# Patient Record
Sex: Female | Born: 1995 | Race: White | Hispanic: No | Marital: Married | Smoking: Current every day smoker
Health system: Southern US, Community
[De-identification: ages and names within clinical notes are randomized; demographics above are authoritative.]

## PROBLEM LIST (undated history)

## (undated) ENCOUNTER — Emergency Department (HOSPITAL_COMMUNITY): Admission: EM | Payer: Medicaid Other | Source: Home / Self Care

## (undated) DIAGNOSIS — N809 Endometriosis, unspecified: Secondary | ICD-10-CM

## (undated) DIAGNOSIS — K449 Diaphragmatic hernia without obstruction or gangrene: Secondary | ICD-10-CM

## (undated) DIAGNOSIS — E282 Polycystic ovarian syndrome: Secondary | ICD-10-CM

## (undated) HISTORY — PX: ESOPHAGOGASTRODUODENOSCOPY: SHX1529

## (undated) HISTORY — PX: TONSILLECTOMY: SUR1361

## (undated) HISTORY — PX: ADENOIDECTOMY: SUR15

---

## 2020-06-08 ENCOUNTER — Emergency Department (HOSPITAL_COMMUNITY)
Admission: EM | Admit: 2020-06-08 | Discharge: 2020-06-08 | Disposition: A | Discharge status: Home / Self Care | Payer: Self-pay | Attending: Emergency Medicine | Admitting: Emergency Medicine

## 2020-06-08 ENCOUNTER — Encounter (HOSPITAL_COMMUNITY): Payer: Self-pay | Admitting: *Deleted

## 2020-06-08 ENCOUNTER — Other Ambulatory Visit: Payer: Self-pay

## 2020-06-08 DIAGNOSIS — L03011 Cellulitis of right finger: Secondary | ICD-10-CM | POA: Insufficient documentation

## 2020-06-08 HISTORY — DX: Endometriosis, unspecified: N80.9

## 2020-06-08 MED ORDER — IBUPROFEN 800 MG PO TABS
800.0000 mg | ORAL_TABLET | Freq: Once | ORAL | Status: AC
  Administered 2020-06-08: 800 mg via ORAL
  Filled 2020-06-08: qty 1

## 2020-06-08 MED ORDER — DOXYCYCLINE HYCLATE 100 MG PO TABS
100.0000 mg | ORAL_TABLET | Freq: Once | ORAL | Status: AC
  Administered 2020-06-08: 100 mg via ORAL
  Filled 2020-06-08: qty 1

## 2020-06-08 MED ORDER — DOXYCYCLINE HYCLATE 100 MG PO CAPS: 100.0000 mg | ORAL_CAPSULE | Freq: Two times a day (BID) | ORAL | 0 refills | Status: AC

## 2020-06-08 MED ORDER — LIDOCAINE HCL 2 % IJ SOLN
10.0000 mL | Freq: Once | INTRAMUSCULAR | Status: AC
  Administered 2020-06-08: 200 mg
  Filled 2020-06-08: qty 20

## 2020-06-08 NOTE — ED Triage Notes (Signed)
The tip of her her right third finger is red and swollen. She tried to lance it last night, had some drainage. Swelling returned. No injury, states it felt like an ingrown fingernail.

## 2020-06-08 NOTE — ED Provider Notes (Signed)
[facility]-EMERGENCY DEPT Provider Note   CSN: [id number] Arrival date & time: 06/08/20  1428     History Chief Complaint  Patient presents with  . Hand Pain    Erica Rivers is a 25 y.o. female who presents with concern for swelling and pain to the right middle finger as well as redness x3 days.  Patient states she frequently gets small infections around the nailbeds, as she has frequent ingrown nails and gets her nails done at nail salons fairly frequently, states she usually drains them on her own with something sharp at home. She states that she utilized an ice pack and then razor blades last night to try to open up the infection with significant amount of pus drained from the area, however it close back up and is swollen more this morning and is giving her severe pain, which is not relieved by ibuprofen or Tylenol.  She denies any fevers or chills, denies any pain in her proximal third finger or right hand.  I personally reviewed this patient's medical records she is history of endometriosis and multiple antibiotic allergies, which reaction she describes as anaphylactic shock.  HPI     Past Medical History:  Diagnosis Date  . Endometriosis     There are no problems to display for this patient.   History reviewed. No pertinent surgical history.   OB History   No obstetric history on file.     No family history on file.     Home Medications Prior to Admission medications   Medication Sig Start Date End Date Taking? Authorizing Provider  doxycycline (VIBRAMYCIN) 100 MG capsule Take 1 capsule (100 mg total) by mouth 2 (two) times daily for 5 days. 06/08/20 06/13/20 Yes Sponseller, Rebekah R, PA-C    Allergies    Bactrim [sulfamethoxazole-trimethoprim], Levaquin [levofloxacin], and Penicillins  Review of Systems   Review of Systems  Constitutional: Negative.   HENT: Negative.   Eyes: Negative.   Respiratory: Negative.   Cardiovascular: Negative.    Gastrointestinal: Negative.   Genitourinary: Negative.   Skin: Positive for color change.       Redness and swelling to right third finger  Neurological: Negative.     Physical Exam Updated Vital Signs BP (!) 140/93 (BP Location: Right Arm)   Pulse 96   Temp 98.5 F (36.9 C) (Oral)   Resp 18   Ht 5\' 4"  (1.626 m)   Wt 120.2 kg   SpO2 96%   BMI 45.49 kg/m   Physical Exam Vitals and nursing note reviewed.  HENT:     Head: Normocephalic and atraumatic.     Nose: Nose normal.  Eyes:     General: No scleral icterus.       Right eye: No discharge.        Left eye: No discharge.     Conjunctiva/sclera: Conjunctivae normal.  Cardiovascular:     Rate and Rhythm: Normal rate.     Pulses:          Radial pulses are 2+ on the right side and 2+ on the left side.  Pulmonary:     Effort: Pulmonary effort is normal.  Musculoskeletal:     Right hand: Swelling and tenderness present. Normal capillary refill.     Left hand: Normal.       Hands:     Comments: Full sensation and ROM of the PIP and MCP joints of the right third finger. Normal cap refill  Skin:  General: Skin is warm and dry.     Capillary Refill: Capillary refill takes less than 2 seconds.  Neurological:     General: No focal deficit present.     Mental Status: She is alert.  Psychiatric:        Mood and Affect: Mood normal.     ED Results / Procedures / Treatments   Labs (all labs ordered are listed, but only abnormal results are displayed) Labs Reviewed - No data to display  EKG None  Radiology No results found.  Procedures .Marland KitchenIncision and Drainage  Date/Time: 06/08/2020 5:25 PM Performed by: Paris Lore, PA-C Authorized by: Paris Lore, PA-C   Consent:    Consent obtained:  Verbal   Consent given by:  Patient   Risks discussed:  Bleeding, incomplete drainage, pain and damage to other organs   Alternatives discussed:  No treatment Universal protocol:    Procedure explained  and questions answered to patient or proxy's satisfaction: yes     Relevant documents present and verified: yes     Test results available : yes     Imaging studies available: yes     Required blood products, implants, devices, and special equipment available: yes     Site/side marked: yes     Immediately prior to procedure, a time out was called: yes     Patient identity confirmed:  Verbally with patient Location:    Indications for incision and drainage: Paronychia.   Location:  Upper extremity   Upper extremity location:  Finger   Finger location:  R long finger Pre-procedure details:    Skin preparation:  Betadine Anesthesia:    Anesthesia method:  Nerve block   Block location:   right third finger   Block needle gauge:  25 G   Block anesthetic:  Lidocaine 2% w/o epi   Block technique:  Digital block    Block injection procedure:  Anatomic landmarks identified, introduced needle, negative aspiration for blood and incremental injection   Block outcome:  Anesthesia achieved Procedure type:    Complexity:  Complex Procedure details:    Incision type: 11 blade scalpel swept along the eponychial fold.   Incision depth:  Subcutaneous   Scalpel blade:  11   Wound management:  Probed and deloculated, irrigated with saline and extensive cleaning   Drainage:  Purulent and bloody   Drainage amount:  Copious   Wound treatment:  Wound left open   Packing materials:  None Post-procedure details:    Procedure completion:  Tolerated well, no immediate complications     Medications Ordered in ED Medications  lidocaine (XYLOCAINE) 2 % (with pres) injection 200 mg (200 mg Other Given 06/08/20 1621)  doxycycline (VIBRA-TABS) tablet 100 mg (100 mg Oral Given 06/08/20 1720)  ibuprofen (ADVIL) tablet 800 mg (800 mg Oral Given 06/08/20 1720)    ED Course  I have reviewed the triage vital signs and the nursing notes.  Pertinent labs & imaging results that were available during my care of the  patient were reviewed by me and considered in my medical decision making (see chart for details).    MDM Rules/Calculators/A&P                         25 year old female presents with 3 days of infection to the right middle distal finger around the nail bed.  Hypertensive on intake, vital signs otherwise normal.  Physical exam concerning for paronychia of the right  third finger. Drained as recorded above. Patient tolerated well.   First dose of doxy given in ED, with ibuprofen. Recommend PCP follow up.   Nautia voiced understanding of her medical evaluation and treatment plan.  Each of her questions answered to her expressed aspect.  Return precautions given.  Patient is well-appearing, stable, and appropriate for discharge at this time.  This chart was dictated using voice recognition software, Dragon. Despite the best efforts of this provider to proofread and correct errors, errors may still occur which can change documentation meaning.  Final Clinical Impression(s) / ED Diagnoses Final diagnoses:  Paronychia of finger, right    Rx / DC Orders ED Discharge Orders         Ordered    doxycycline (VIBRAMYCIN) 100 MG capsule  2 times daily        06/08/20 1713           Sponseller, Eugene Gavia, PA-C 06/08/20 1747    Rozelle Logan, DO 06/08/20 1751

## 2020-06-08 NOTE — Discharge Instructions (Signed)
You were seen in the emergency department today for the infection in the tip of your finger.  Fortunately, we were able to drain a significant amount of  pus from your finger.  You were administered ibuprofen and first dose of antibiotics while in the emergency department.  You was prescribed 5 days of antibiotics to take twice daily at home.  Please take them for the entire course as prescribed.  You may continue to take Tylenol/ ibuprofen as needed for your symptoms.  Return to the emergency department if you develop worsening pain, swelling, redness streaking up your hand, weakness in your hand, or any other new severe symptoms.

## 2020-06-11 ENCOUNTER — Emergency Department (HOSPITAL_COMMUNITY): Payer: Self-pay

## 2020-06-11 ENCOUNTER — Other Ambulatory Visit: Payer: Self-pay

## 2020-06-11 ENCOUNTER — Emergency Department (HOSPITAL_COMMUNITY)
Admission: EM | Admit: 2020-06-11 | Discharge: 2020-06-11 | Disposition: A | Discharge status: Home / Self Care | Payer: Self-pay | Attending: Emergency Medicine | Admitting: Emergency Medicine

## 2020-06-11 ENCOUNTER — Encounter (HOSPITAL_COMMUNITY): Payer: Self-pay

## 2020-06-11 DIAGNOSIS — F17210 Nicotine dependence, cigarettes, uncomplicated: Secondary | ICD-10-CM | POA: Insufficient documentation

## 2020-06-11 DIAGNOSIS — K921 Melena: Secondary | ICD-10-CM | POA: Insufficient documentation

## 2020-06-11 DIAGNOSIS — K920 Hematemesis: Secondary | ICD-10-CM | POA: Insufficient documentation

## 2020-06-11 HISTORY — DX: Polycystic ovarian syndrome: E28.2

## 2020-06-11 HISTORY — DX: Diaphragmatic hernia without obstruction or gangrene: K44.9

## 2020-06-11 LAB — CBC WITH DIFFERENTIAL/PLATELET
Abs Immature Granulocytes: 0.05 10*3/uL (ref 0.00–0.07)
Basophils Absolute: 0.1 10*3/uL (ref 0.0–0.1)
Basophils Relative: 1 %
Eosinophils Absolute: 0.3 10*3/uL (ref 0.0–0.5)
Eosinophils Relative: 3 %
HCT: 45.6 % (ref 36.0–46.0)
Hemoglobin: 14.7 g/dL (ref 12.0–15.0)
Immature Granulocytes: 1 %
Lymphocytes Relative: 24 %
Lymphs Abs: 2.4 10*3/uL (ref 0.7–4.0)
MCH: 29.8 pg (ref 26.0–34.0)
MCHC: 32.2 g/dL (ref 30.0–36.0)
MCV: 92.3 fL (ref 80.0–100.0)
Monocytes Absolute: 0.7 10*3/uL (ref 0.1–1.0)
Monocytes Relative: 7 %
Neutro Abs: 6.5 10*3/uL (ref 1.7–7.7)
Neutrophils Relative %: 64 %
Platelets: 320 10*3/uL (ref 150–400)
RBC: 4.94 MIL/uL (ref 3.87–5.11)
RDW: 13.5 % (ref 11.5–15.5)
WBC: 10.1 10*3/uL (ref 4.0–10.5)
nRBC: 0 % (ref 0.0–0.2)

## 2020-06-11 LAB — TYPE AND SCREEN
ABO/RH(D): A POS
Antibody Screen: NEGATIVE

## 2020-06-11 LAB — COMPREHENSIVE METABOLIC PANEL
ALT: 27 U/L (ref 0–44)
AST: 24 U/L (ref 15–41)
Albumin: 3.8 g/dL (ref 3.5–5.0)
Alkaline Phosphatase: 66 U/L (ref 38–126)
Anion gap: 9 (ref 5–15)
BUN: 17 mg/dL (ref 6–20)
CO2: 23 mmol/L (ref 22–32)
Calcium: 8.9 mg/dL (ref 8.9–10.3)
Chloride: 110 mmol/L (ref 98–111)
Creatinine, Ser: 1.09 mg/dL — ABNORMAL HIGH (ref 0.44–1.00)
GFR, Estimated: >60 mL/min (ref >60)
Glucose, Bld: 113 mg/dL — ABNORMAL HIGH (ref 70–99)
Potassium: 3.5 mmol/L (ref 3.5–5.1)
Sodium: 142 mmol/L (ref 135–145)
Total Bilirubin: 1.7 mg/dL — ABNORMAL HIGH (ref 0.3–1.2)
Total Protein: 7.3 g/dL (ref 6.5–8.1)

## 2020-06-11 LAB — I-STAT BETA HCG BLOOD, ED (MC, WL, AP ONLY): I-stat hCG, quantitative: <5 m[IU]/mL (ref <5)

## 2020-06-11 LAB — POC OCCULT BLOOD, ED: Fecal Occult Bld: POSITIVE — AB

## 2020-06-11 LAB — LIPASE, BLOOD: Lipase: 36 U/L (ref 11–51)

## 2020-06-11 MED ORDER — ONDANSETRON 4 MG PO TBDP: 4.0000 mg | ORAL_TABLET | Freq: Three times a day (TID) | ORAL | 0 refills | Status: AC | PRN

## 2020-06-11 MED ORDER — SODIUM CHLORIDE 0.9 % IV BOLUS
1000.0000 mL | Freq: Once | INTRAVENOUS | Status: AC
  Administered 2020-06-11: 1000 mL via INTRAVENOUS

## 2020-06-11 MED ORDER — FAMOTIDINE IN NACL 20-0.9 MG/50ML-% IV SOLN
20.0000 mg | Freq: Once | INTRAVENOUS | Status: AC
  Administered 2020-06-11: 20 mg via INTRAVENOUS
  Filled 2020-06-11: qty 50

## 2020-06-11 MED ORDER — ONDANSETRON 4 MG PO TBDP
4.0000 mg | ORAL_TABLET | Freq: Once | ORAL | Status: AC
  Administered 2020-06-11: 4 mg via ORAL
  Filled 2020-06-11: qty 1

## 2020-06-11 MED ORDER — FAMOTIDINE 40 MG PO TABS: 40.0000 mg | ORAL_TABLET | Freq: Every day | ORAL | 0 refills | Status: AC

## 2020-06-11 NOTE — ED Triage Notes (Signed)
Patient c/o bright red rectal bleeding and vomiting bright red blood this AM. Patient states she has had this happen before approx 4 months ago.

## 2020-06-11 NOTE — ED Provider Notes (Signed)
[facility]-EMERGENCY DEPT Provider Note   CSN: [id number] Arrival date & time: 06/11/20  1635     History Chief Complaint  Patient presents with  . Hematemesis  . Rectal Bleeding    Erica Rivers is a 25 y.o. female with a reported history of endometriosis, hiatal hernia, ulcers, PCOS, seizures(not on any medications).  Patient presents with a chief complaint of hematemesis and melena.  She reports that this morning at 8:30 AM she had 1 episode of vomiting and hematemesis.  Patient reports approximately 2 cups of bright red blood in her emesis.  Denies any coffee-ground emesis.  Patient reports vomiting occurred after taking doxycycline on an empty stomach.  Patient states that she has had similar episodes of in the past.  Last episode of hematemesis occurred July 2021.  Patient reports that this occurred when she was living in [place] and she was told in the emerge department that she had a hiatal hernia.    Reports that for the past few days her stool has been darker than usual.  Patient reports that today she had a runny dark black stool.  Patient denies any bright red blood in her stool, rectal bleeding or rectal pain.  She reports that she did take Pepto-Bismol earlier in the day.  Patient also reports that she engages in receptive anal sex.  Last episode of receptive anal sex occurred earlier today.  Patient endorses epigastric pain.  Patient reports that this pain has been constant since July 2021.  Patient denies any radiation of her pain.  Patient reports that this pain is unchanged.  Patient rates her pain 6/10 on the pain scale.  Patient denies any fevers, chills, chest pain, shortness of breath, abdominal distention, diarrhea, constipation, difficulty urinating, dysuria, urinary frequency, hematuria, vaginal bleeding, vaginal discharge, vaginal pain, lightheadedness, dizziness, syncopal episode.  She reports that due to her PCOS she does not have regular periods.   Patient reports that her last period occurred July 2021 when she was taking birth control.  G12 P0-0-12-0  Denies regular alcohol or NSAID use.  HPI     Past Medical History:  Diagnosis Date  . Endometriosis   . Hiatal hernia   . PCOS (polycystic ovarian syndrome)     There are no problems to display for this patient.   Past Surgical History:  Procedure Laterality Date  . ADENOIDECTOMY    . ESOPHAGOGASTRODUODENOSCOPY    . TONSILLECTOMY       OB History   No obstetric history on file.     Family History  Problem Relation Age of Onset  . Endometriosis Mother   . Polycystic ovary syndrome Mother   . Diabetes Father   . Heart failure Father     Social History   Tobacco Use  . Smoking status: Current Every Day Smoker    Packs/day: 1.00    Types: Cigarettes  . Smokeless tobacco: Never Used  Vaping Use  . Vaping Use: Never used  Substance Use Topics  . Alcohol use: Yes  . Drug use: Never    Home Medications Prior to Admission medications   Medication Sig Start Date End Date Taking? Authorizing Provider  doxycycline (VIBRAMYCIN) 100 MG capsule Take 1 capsule (100 mg total) by mouth 2 (two) times daily for 5 days. 06/08/20 06/13/20 Yes Sponseller, Rebekah R, PA-C  ibuprofen (ADVIL) 200 MG tablet Take 440 mg by mouth daily as needed (finger pain).   Yes [provider]    Allergies  Iodine, Penicillins, Shellfish allergy, Bactrim [sulfamethoxazole-trimethoprim], Cobalt, Lamictal [lamotrigine], Levaquin [levofloxacin], Nickel, and Trileptal [oxcarbazepine]  Review of Systems   Review of Systems  Constitutional: Negative for chills and fever.  Eyes: Negative for visual disturbance.  Respiratory: Negative for shortness of breath.   Cardiovascular: Negative for chest pain.  Gastrointestinal: Positive for nausea and vomiting. Negative for abdominal distention, abdominal pain, anal bleeding, blood in stool, constipation, diarrhea and rectal pain.   Genitourinary: Negative for difficulty urinating, dysuria, flank pain, frequency, genital sores, hematuria, pelvic pain, vaginal bleeding, vaginal discharge and vaginal pain.  Musculoskeletal: Negative for back pain and neck pain.  Skin: Negative for color change and rash.  Neurological: Negative for dizziness, syncope, light-headedness and headaches.  Psychiatric/Behavioral: Negative for confusion.    Physical Exam Updated Vital Signs BP 135/86 (BP Location: Left Arm)   Pulse 98   Temp 98.4 F (36.9 C) (Oral)   Resp 16   Ht 5\' 4"  (1.626 m)   Wt 120.2 kg   SpO2 99%   BMI 45.49 kg/m   Physical Exam Vitals and nursing note reviewed. Exam conducted with a chaperone present (Female nurse tech present as chaperone).  Constitutional:      General: She is not in acute distress.    Appearance: She is not ill-appearing, toxic-appearing or diaphoretic.  HENT:     Head: Normocephalic.  Eyes:     General: No scleral icterus.       Right eye: No discharge.        Left eye: No discharge.  Cardiovascular:     Rate and Rhythm: Normal rate.     Heart sounds: Normal heart sounds.  Pulmonary:     Effort: Pulmonary effort is normal. No tachypnea, bradypnea or respiratory distress.     Breath sounds: Normal breath sounds. No decreased breath sounds, wheezing, rhonchi or rales.  Abdominal:     General: Bowel sounds are normal. There is no distension. There are no signs of injury.     Palpations: Abdomen is soft. There is no mass or pulsatile mass.     Tenderness: There is generalized abdominal tenderness. There is no right CVA tenderness, left CVA tenderness, guarding or rebound. Negative signs include McBurney's sign.     Hernia: There is no hernia in the umbilical area or ventral area.     Comments: Mild tenderness throughout abdomen worse in epigastric area  Genitourinary:    Exam position: Knee-chest position.     Rectum: Guaiac result positive. No mass, tenderness, anal fissure,  external hemorrhoid or internal hemorrhoid. Normal anal tone.       Comments: Skin tag noted to right buttocks  No melena, or bright red blood noted on gloved hand after rectal exam Musculoskeletal:     Cervical back: Neck supple.     Right lower leg: No edema.     Left lower leg: No edema.  Skin:    General: Skin is warm and dry.  Neurological:     General: No focal deficit present.     Mental Status: She is alert.  Psychiatric:        Behavior: Behavior is cooperative.     ED Results / Procedures / Treatments   Labs (all labs ordered are listed, but only abnormal results are displayed) Labs Reviewed  COMPREHENSIVE METABOLIC PANEL - Abnormal; Notable for the following components:      Result Value   Glucose, Bld 113 (*)    Creatinine, Ser 1.09 (*)    Total  Bilirubin 1.7 (*)    All other components within normal limits  POC OCCULT BLOOD, ED - Abnormal; Notable for the following components:   Fecal Occult Bld POSITIVE (*)    All other components within normal limits  CBC WITH DIFFERENTIAL/PLATELET  LIPASE, BLOOD  I-STAT BETA HCG BLOOD, ED ([facility], [facility], [facility] ONLY)  TYPE AND SCREEN  ABO/RH    EKG None  Radiology CT Abdomen Pelvis Wo Contrast  Result Date: 06/11/2020 CLINICAL DATA:  Upper abdominal pain. EXAM: CT ABDOMEN AND PELVIS WITHOUT CONTRAST TECHNIQUE: Multidetector CT imaging of the abdomen and pelvis was performed following the standard protocol without IV contrast. COMPARISON:  None. FINDINGS: Lower chest: No acute abnormality. Hepatobiliary: No focal liver abnormality is seen. No gallstones, gallbladder wall thickening, or biliary dilatation. Pancreas: Unremarkable. No pancreatic ductal dilatation or surrounding inflammatory changes. Spleen: Normal in size without focal abnormality. Adrenals/Urinary Tract: Adrenal glands are unremarkable. Kidneys are normal, without renal calculi, focal lesion, or hydronephrosis. Bladder is unremarkable. Stomach/Bowel: Stomach is within  normal limits. Appendix appears normal. No evidence of bowel wall thickening, distention, or inflammatory changes. Vascular/Lymphatic: No significant vascular findings are present. No enlarged abdominal or pelvic lymph nodes. Reproductive: Uterus and bilateral adnexa are unremarkable. Other: No abdominal wall hernia or abnormality. No abdominopelvic ascites. Musculoskeletal: No acute or significant osseous findings. IMPRESSION: No acute or active process within the abdomen or pelvis. Electronically Signed   By: Aram Candela M.D.   On: 06/11/2020 19:51    Procedures Procedures   Medications Ordered in ED Medications  ondansetron (ZOFRAN-ODT) disintegrating tablet 4 mg (4 mg Oral Given 06/11/20 1810)  sodium chloride 0.9 % bolus 1,000 mL (0 mLs Intravenous Stopped 06/11/20 2053)  famotidine (PEPCID) IVPB 20 mg premix (0 mg Intravenous Stopped 06/11/20 2053)    ED Course  I have reviewed the triage vital signs and the nursing notes.  Pertinent labs & imaging results that were available during my care of the patient were reviewed by me and considered in my medical decision making (see chart for details).    MDM Rules/Calculators/A&P                          Alert 25 year old female no acute stress, nontoxic appearing.  Presents with chief complaint of one episode of hematemesis and one episode of melena.  Both episodes happened today.  Reports that she has a history of hiatal hernia and ulcers.  Patient reports that she has had similar episodes of hematemesis and melena in the past related to these symptoms.  The last episode occurred 10/2019.  Patient reports that she has followed up with GI in the past however is new to the area and has not established care with GI here.  On physical exam normoactive bowel sounds, abdomen soft, nondistended, patient has mild tenderness throughout her abdomen, with creased tenderness to epigastric area.  No mass, pulsatile mass, umbilical or ventral hernia noted.   Due to patient's complaint of melena rectal exam was performed, No melena, or bright red blood noted on gloved hand after rectal exam however Hemoccult positive.  We will obtain i-STAT beta-hCG, lipase, CBC, CMP, type and screen.  Patient given Zofran, 1 L fluid bolus, and Pepcid.  CBC is within normal limits.  No signs of acute blood loss anemia. CMP shows elevated creatinine; likely due to dehydration.  Total bili also elevated at 1.7, AST, ALT, alk phos all within normal limits.   Lipase within normal  limits low suspicion for acute pancreatitis. I-STAT beta-hCG less than 5 low suspicion for intrauterine or ectopic pregnancy.  Due to patient's complaint of hematemesis and generalized abdominal pain will obtain noncontrast CT scan of abdomen pelvis.  Noncontrast due to patient's history of iodine allergy.  Noncontrast CT abdomen pelvis showed no acute or active process within the abdomen or pelvis.  On serial reexamination patient's abdomen is soft, nondistended, no change in her tenderness.  Patient was not observed to have any episodes of vomiting throughout her ED stay.  Patient has been hemodynamically stable throughout her ED stay.    Possible that patient's dark stool was due to Pepto-Bismol use.  Will give patient prescription for Protonix and Zofran.  Will have patient follow-up with GI provider.  Discussed results, findings, treatment and follow up. Patient advised of return precautions. Patient verbalized understanding and agreed with plan.    Final Clinical Impression(s) / ED Diagnoses Final diagnoses:  Hematemesis with nausea    Rx / DC Orders ED Discharge Orders         Ordered    famotidine (PEPCID) 40 MG tablet  Daily        06/11/20 2040    ondansetron (ZOFRAN ODT) 4 MG disintegrating tablet  Every 8 hours PRN        06/11/20 2040           Berneice Heinrich 06/12/20 0029    Gwyneth Sprout, MD 06/14/20 2011

## 2020-06-11 NOTE — Discharge Instructions (Addendum)
You came to the emergency department today to be evaluated for your episode of bloody emesis and melena. Your CT scan showed no acute abnormality. Physical exam and lab work were reassuring. I have given you prescription for times. Please take 1 pill once a day. I have given you a prescription for Zofran. Please take this medication every 8 hours as needed for nausea.  Get help right away if: You faint. You feel weak or dizzy. You are urinating less than normal or not at all. You vomit up: Large amounts of blood or dark material that may look like coffee grounds. Bright red blood. You have any of the following: Persistent vomiting. A rapid heartbeat. Blood in your stool. Chest pain. Difficulty breathing.

## 2020-06-20 ENCOUNTER — Other Ambulatory Visit: Payer: Self-pay

## 2020-06-20 ENCOUNTER — Ambulatory Visit (INDEPENDENT_AMBULATORY_CARE_PROVIDER_SITE_OTHER): Payer: No Payment, Other | Admitting: Licensed Clinical Social Worker

## 2020-06-20 DIAGNOSIS — F4310 Post-traumatic stress disorder, unspecified: Secondary | ICD-10-CM | POA: Diagnosis not present

## 2020-06-22 ENCOUNTER — Encounter (HOSPITAL_COMMUNITY): Payer: Self-pay | Admitting: Psychiatry

## 2020-06-22 ENCOUNTER — Ambulatory Visit (INDEPENDENT_AMBULATORY_CARE_PROVIDER_SITE_OTHER): Payer: No Payment, Other | Admitting: Psychiatry

## 2020-06-22 ENCOUNTER — Other Ambulatory Visit: Payer: Self-pay

## 2020-06-22 VITALS — BP 136/94 | HR 102 | Ht 64.0 in | Wt 267.4 lb

## 2020-06-22 DIAGNOSIS — F411 Generalized anxiety disorder: Secondary | ICD-10-CM | POA: Insufficient documentation

## 2020-06-22 DIAGNOSIS — F3160 Bipolar disorder, current episode mixed, unspecified: Secondary | ICD-10-CM

## 2020-06-22 DIAGNOSIS — F4310 Post-traumatic stress disorder, unspecified: Secondary | ICD-10-CM | POA: Diagnosis not present

## 2020-06-22 MED ORDER — HYDROXYZINE HCL 10 MG PO TABS: 10.0000 mg | ORAL_TABLET | Freq: Three times a day (TID) | ORAL | 2 refills | Status: DC | PRN

## 2020-06-22 MED ORDER — ARIPIPRAZOLE 5 MG PO TABS: 5.0000 mg | ORAL_TABLET | Freq: Every day | ORAL | 2 refills | Status: DC

## 2020-06-22 MED ORDER — TRAZODONE HCL 50 MG PO TABS: 50.0000 mg | ORAL_TABLET | Freq: Every day | ORAL | 2 refills | Status: DC
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
## 2020-06-26 ENCOUNTER — Telehealth (HOSPITAL_COMMUNITY): Payer: Self-pay | Admitting: *Deleted

## 2020-06-26 NOTE — Telephone Encounter (Signed)
VM left for writer from [place] stating her medicine has given her tremors and she is needing something for them. Reviewed her med list and she is on abilify which could cause tremors. Will forward concern to her provider, Dr Doyne Keel to consider.

## 2020-06-27 ENCOUNTER — Other Ambulatory Visit (HOSPITAL_COMMUNITY): Payer: Self-pay | Admitting: Psychiatry

## 2020-06-27 DIAGNOSIS — F411 Generalized anxiety disorder: Secondary | ICD-10-CM

## 2020-06-27 DIAGNOSIS — R251 Tremor, unspecified: Secondary | ICD-10-CM

## 2020-06-27 MED ORDER — PROPRANOLOL HCL 40 MG PO TABS: 40.0000 mg | ORAL_TABLET | Freq: Two times a day (BID) | ORAL | 2 refills | Status: AC

## 2020-06-27 NOTE — Telephone Encounter (Signed)
Patient notes that after she takes her second dose of hydroxyzine she has tremors in her left arm.  Provider informed patient that hydroxyzine and Abilify can induce tremors.  Provider recommended switching medications or reducing the dose.  Patient notes that she likes her medication and does not want them changed.  She notes that in the short time she has been taking her medication her anxiety has improved and notes that she has less manic episodes.  Today she is agreeable to starting propanolol 40 mg twice daily to help manage tremors as well as anxiety.  She will continue all other medications prescribed.  She asked  provider if she can be diagnosed with disassociative personality disorder as she has discontinued her marijuana use.  Provider informed patient that she would be reassessed at her next visit.  No other concerns noted at this time.

## 2020-07-05 ENCOUNTER — Telehealth (HOSPITAL_COMMUNITY): Payer: Self-pay | Admitting: *Deleted

## 2020-07-05 NOTE — Telephone Encounter (Signed)
Call from patient stating the hydroxyzine wasn't really helping her any more and she wanted to see if her medicine could be increased to 10 mg. Reviewed record and the med list indicates 10 mg tid prn per Dr Doyne Keel. Called [place] back and she is taking 10 mg bid for the last few days and it helps some but states her bottle says to take 5 mg tid prn. Encouraged to continue at 10 mg at a time with the freedom to take it three times a day, and to do so if highly anxious rather than just two.states she is going thru some big changes, good ones but stressful none the less. To call back as needed.

## 2020-07-06 ENCOUNTER — Telehealth (HOSPITAL_COMMUNITY): Payer: Self-pay | Admitting: *Deleted

## 2020-07-06 ENCOUNTER — Other Ambulatory Visit (HOSPITAL_COMMUNITY): Payer: Self-pay | Admitting: Psychiatry

## 2020-07-06 DIAGNOSIS — F411 Generalized anxiety disorder: Secondary | ICD-10-CM

## 2020-07-06 MED ORDER — HYDROXYZINE HCL 25 MG PO TABS
25.0000 mg | ORAL_TABLET | Freq: Three times a day (TID) | ORAL | 2 refills | Status: DC | PRN
Start: 2020-07-06 — End: 2020-09-21

## 2020-07-06 NOTE — Telephone Encounter (Signed)
Provider attempted to call patient twice without success.  Provider increase hydroxyzine 10 mg 3 times daily to 25 mg 3 times daily as needed to help manage anxiety.  Provider left patient a message detailing this.  Provider informed patient to call if she had questions or concerns.  Medication sent to preferred pharmacy.  No other concerns noted at this time.

## 2020-07-06 NOTE — Telephone Encounter (Signed)
Erica Rivers called this am to state she checked her rx bottle and the hydroxyzine dose is 10 mg she has been taking rather than 5 mg as she told me yesterday. She is concerned its not really helping her and would like Dr Doyne Keel to consider increasing it. Will let provider know.

## 2020-07-06 NOTE — Telephone Encounter (Signed)
Called Erica Rivers back with information re the increase in her hydroxyzine dose and sig.

## 2020-07-24 ENCOUNTER — Encounter (HOSPITAL_COMMUNITY): Payer: Self-pay | Admitting: Psychiatry

## 2020-08-03 ENCOUNTER — Other Ambulatory Visit: Payer: Self-pay

## 2020-08-03 ENCOUNTER — Ambulatory Visit (INDEPENDENT_AMBULATORY_CARE_PROVIDER_SITE_OTHER): Payer: No Payment, Other | Admitting: Licensed Clinical Social Worker

## 2020-08-03 DIAGNOSIS — F4310 Post-traumatic stress disorder, unspecified: Secondary | ICD-10-CM | POA: Diagnosis not present

## 2020-08-07 NOTE — Progress Notes (Signed)
   THERAPIST PROGRESS NOTE  Session Time: 45 min  Participation Level: Active  Behavioral Response: CasualAlertAnxious  Type of Therapy: Individual Therapy  Treatment Goals addressed: Communication: dep/anx/coping  Interventions: Supportive and Other: additional assessment  Summary: Erica Rivers is a 25 y.o. female who presents with hx of PTSD. This date pt comes for in person session. Pt drove self to appt. This is first session since initial session. When asked how pt has been since first meeting she states "It's been like a roller coaster". Pt reports she is still living with her wife and husband. She states "both relationships almost ended". Pt reports they end up arguing about "alone time with him", Erica Rivers. Pt reports irritability/anger. She then states they are all getting along at present. She states she and Erica Rivers are planning their wedding. She further states they are all working on a plan to move to [place]. She states Erica Rivers refuses meds and he has Bipolar Dis and PTSD. She reports Erica Rivers wants to get off pain meds she is on and cannabis use would help both of them. Pt reports they want to live where it is legal to use. Pt states they are currently living in [facility] and Erica Rivers did add pt and Erica Rivers to lease. LCSW assessed for pt's typical day. 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 Pt reports she is working on her GED. She says she is almost done and then wants to get her BS in psychology from [employer]. LCSW assessed for status of communication with mother. Pt states she had her blocked on everything until yesterday. She advises she called mother to invite her to wedding with Erica Rivers. Pt states they got in an argument. Mother is reportedly getting married in the coming months too. Pt states she will "...have to change my colors because hers are the same". LCSW assisted pt to  reframe this with introduction of self talk and logic/reality. Assessed for overall goal of relationship with mother and pt states "I don't want one". She reports mother continues to keep her minor sister Erica Rivers(pronounced"Meeka") from her and how painful this is. Pt admits she is angry often and may have an anger management problem. She has never had anger management training per her report. Pt agrees to take self talk literature home to read. LCSW reviewed poc including scheduling prior to close of session. Pt states appreciation for care.    Suicidal/Homicidal: Nowithout intent/plan  Therapist Response: Pt remains receptive to care.  Plan: Return again for next avail appt.  Diagnosis: Axis I: Post Traumatic Stress Disorder  [place] Sink, LCSW 08/07/2020

## 2020-08-08 ENCOUNTER — Other Ambulatory Visit (HOSPITAL_COMMUNITY): Payer: Self-pay | Admitting: Psychiatry

## 2020-08-08 ENCOUNTER — Telehealth (HOSPITAL_COMMUNITY): Payer: Self-pay | Admitting: *Deleted

## 2020-08-08 DIAGNOSIS — F411 Generalized anxiety disorder: Secondary | ICD-10-CM

## 2020-08-08 DIAGNOSIS — F3160 Bipolar disorder, current episode mixed, unspecified: Secondary | ICD-10-CM

## 2020-08-08 MED ORDER — TRAZODONE HCL 50 MG PO TABS: 50.0000 mg | ORAL_TABLET | Freq: Every day | ORAL | 2 refills | Status: DC

## 2020-08-08 MED ORDER — TRAZODONE HCL 100 MG PO TABS: 100.0000 mg | ORAL_TABLET | Freq: Every day | ORAL | 2 refills | Status: DC

## 2020-08-08 MED ORDER — ARIPIPRAZOLE (SENSOR) 10 MG PO TABS: 10.0000 mg | ORAL_TABLET | Freq: Every day | ORAL | 2 refills | Status: DC

## 2020-08-08 MED ORDER — ARIPIPRAZOLE 5 MG PO TABS: 5.0000 mg | ORAL_TABLET | Freq: Every day | ORAL | 2 refills | Status: DC

## 2020-08-08 MED ORDER — BUSPIRONE HCL 10 MG PO TABS: 10.0000 mg | ORAL_TABLET | Freq: Three times a day (TID) | ORAL | 2 refills | Status: DC

## 2020-08-08 NOTE — Telephone Encounter (Signed)
Patient notes that she has been having delusions noting that she believes her family is abandoning her.  She also notes that she has been having increased visual hallucinations noting that she sees the walls melting infront of her.  She informed Clinical research associate that she is only sleeping 3 to 4 hours nightly.  She also notes that hydroxyzine is ineffective and managing her anxiety.  Today she is agreeable to increasing Abilify 5 mg to 10 mg to help manage symptoms of psychosis and mood.  She is also agreeable to increasing trazodone 50 mg to 100 mg to help manage sleep.  She will start BuSpar 10 mg 3 times daily to help manage anxiety. Potential side effects of medication and risks vs benefits of treatment vs non-treatment were explained and discussed. All questions were answered.  No concerns noted at this time.

## 2020-08-08 NOTE — Telephone Encounter (Signed)
VM left on writers phone asking to get a message to her provider which is Dr Doyne Keel to ask if her Abilify can be increased to 10 mg as she has been on 5 mg and she doesn't feel its helping her any more. Will forward this message to provider.

## 2020-08-17 ENCOUNTER — Other Ambulatory Visit: Payer: Self-pay

## 2020-08-17 ENCOUNTER — Ambulatory Visit (INDEPENDENT_AMBULATORY_CARE_PROVIDER_SITE_OTHER): Payer: No Payment, Other | Admitting: Licensed Clinical Social Worker

## 2020-08-17 DIAGNOSIS — F4310 Post-traumatic stress disorder, unspecified: Secondary | ICD-10-CM

## 2020-08-22 ENCOUNTER — Telehealth: Payer: Self-pay

## 2020-08-22 NOTE — Telephone Encounter (Signed)
Copied from CRM [id number]. Topic: Appointment Scheduling - Scheduling Inquiry for Clinic >> Aug 22, 2020 11:52 AM Wyonia Hough E wrote: Reason for CRM: Pt would like speak with Mikle Bosworth about financial assistance/ Pt has a NP appt in July and had family planning medicaid only/ please advise

## 2020-08-22 NOTE — Telephone Encounter (Signed)
I return Pt call, LVM inform the Pt that she need to see the provider first before she can apply for financial assistance here at the clinic

## 2020-08-23 NOTE — Progress Notes (Signed)
   THERAPIST PROGRESS NOTE  Session Time: 45 min  Participation Level: Active  Behavioral Response: CasualAlertmildly anxious  Type of Therapy: Individual Therapy  Treatment Goals addressed: Anxiety and Coping  Interventions: CBT and Supportive  Summary: Erica Rivers is a 25 y.o. female who presents with hx of PTSD/anx/dep. This date pt returns for in person session. She states Erica Rivers drove her and is in the car. Pt states she is doing "good" when asked. Pt advises she just went on a "mini vacation". Pt reports her aunt came to pick her up and she went to grandmother's for 3 days. Pt states she got good rest, had a nice visit and it was very helpful to have a break from the day to day routine she is in. Pt states relationships with Erica Rivers and Erica Rivers are more stable with better communication. Pt advises Erica Rivers "officially asked me to marry her". She shows a new ring and states Erica Rivers got down on one knee, etc. They are all still planning to move to [place] but feels it will take some time as they try to save money. Pt continues to stay out of work. 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 She is looking forward to extra income for the household. She is spending time on GED and states that is going well. Pt states her mood is better with no panic attacks since starting Buspirone, less irritable. Pt has had no contact with mother and states "I don't feel bad about it. Pt brings up self talk literature LCSW provided last session. She states she has found this very helpful, states she is "catching myself" re negative statements. Reports she also found an app that gives her daily affirmations on her phone, which is helping her stay more positive. LCSW provided additional education on self talk/CBT. Pt denies other changes or new worries/concerns. LCSW reviewed poc with pts agreement including scheduling. Pt states appreciation for care.     Suicidal/Homicidal: Nowithout intent/plan  Therapist Response: Pt remains receptive to care.  Plan: Return again in 4 weeks.  Diagnosis: Axis I: Post Traumatic Stress Disorder  [place] Sink, LCSW 08/23/2020

## 2020-09-19 ENCOUNTER — Ambulatory Visit (HOSPITAL_COMMUNITY): Payer: No Payment, Other | Admitting: Licensed Clinical Social Worker

## 2020-09-19 ENCOUNTER — Ambulatory Visit (HOSPITAL_COMMUNITY): Payer: No Payment, Other | Admitting: Psychiatry

## 2020-09-21 ENCOUNTER — Ambulatory Visit (INDEPENDENT_AMBULATORY_CARE_PROVIDER_SITE_OTHER): Payer: No Payment, Other | Admitting: Psychiatry

## 2020-09-21 ENCOUNTER — Ambulatory Visit (HOSPITAL_COMMUNITY): Payer: No Payment, Other | Admitting: *Deleted

## 2020-09-21 ENCOUNTER — Other Ambulatory Visit: Payer: Self-pay

## 2020-09-21 ENCOUNTER — Encounter (HOSPITAL_COMMUNITY): Payer: Self-pay | Admitting: Psychiatry

## 2020-09-21 ENCOUNTER — Encounter (HOSPITAL_COMMUNITY): Payer: Self-pay

## 2020-09-21 VITALS — BP 112/70 | HR 68 | Ht 63.0 in | Wt 269.0 lb

## 2020-09-21 DIAGNOSIS — F3160 Bipolar disorder, current episode mixed, unspecified: Secondary | ICD-10-CM

## 2020-09-21 DIAGNOSIS — F411 Generalized anxiety disorder: Secondary | ICD-10-CM

## 2020-09-21 MED ORDER — TRAZODONE HCL 100 MG PO TABS: 100.0000 mg | ORAL_TABLET | Freq: Every day | ORAL | 2 refills | Status: DC

## 2020-09-21 MED ORDER — BUSPIRONE HCL 15 MG PO TABS: 15.0000 mg | ORAL_TABLET | Freq: Three times a day (TID) | ORAL | 2 refills | Status: DC

## 2020-09-21 MED ORDER — ABILIFY MAINTENA 400 MG IM PRSY: 400.0000 mg | PREFILLED_SYRINGE | INTRAMUSCULAR | 12 refills | Status: DC

## 2020-09-21 MED ORDER — ARIPIPRAZOLE ER 400 MG IM PRSY
400.0000 mg | PREFILLED_SYRINGE | INTRAMUSCULAR | Status: AC
  Administered 2020-09-21 – 2020-12-13 (×4): 400 mg via INTRAMUSCULAR

## 2020-09-21 MED ORDER — ARIPIPRAZOLE ER 400 MG IM PRSY: 400.0000 mg | PREFILLED_SYRINGE | Freq: Once | INTRAMUSCULAR | Status: AC

## 2020-09-21 NOTE — Progress Notes (Signed)
In today for an appt with her provider, Dr Doyne Keel. She determined she would be starting on Abilify M 400 mg today. Writer gave her the first injection in her R deltoid  without difficulty. She is pleasant and cooperative and fine with getting an injection. Med ed provided re injection. She completed patient assistance forms as she is uninsured. She is to return in one month for her next injection, and three months with the provider.

## 2020-09-21 NOTE — Progress Notes (Signed)
BH MD/PA/NP OP Progress Note  09/21/2020 11:13 AM Erica Rivers  MRN:  [id number]  Chief Complaint: " Things are better with medications" Chief Complaint   Medication Management    HPI: 25 year old female seen today for follow up psychiatric evaluation.  She has a psychiatric history of of marijuana use disorder, bipolar disorder, schizophrenia, and disassociative identity disorder.  She is currently managed on Abilify 10 mg daily, trazodone 50 mg nightly, hydroxyzine 25 mg 3 times daily, and BuSpar 10 mg 3 times daily.  She notes her medications are somewhat effective in managing her psychiatric conditions.  Today she is well-groomed, pleasant, cooperative, and engaged in conversation.  She informed provider that she is happy because she and her partners are getting married.  She notes that recently her boyfriend proposed to her and then she proposed her girlfriend.  She notes that the 3 of them will be getting married in September.  She notes that since being on Abilify her mood has been more stable.  She however still notes at times she becomes irritable, has fluctuations in mood, distractibility, and racing thoughts.  She also notes that she has visual hallucinations noting that she sees grass or other objects turn into ocean waves.  She denies auditory hallucinations.  Patient informed Clinical research associatewriter that she continues to have different personalities however informed Clinical research associatewriter that if she is not triggered these personalities did not appear.  Provider asked patient if she continues to utilize marijuana or other illegal substances and she notes that she does not.    Patient informed writer that her anxiety and depression continues to be high.  Today provider conducted a GAD-7 and patient scored a 19, at her last visit she scored a 20.  Provider also conducted a PHQ-9 and patient scored a 17, at her last visit she scored a 19.  She notes that she is sleeping 5 to 6 hours nightly.  She informed provider that her  appetite is poor however reports that she has been gaining weight noting that she has gained 5 pounds within the last 3 months.  She notes that her soon-to-be husband has her and her soon-to-be wife on a [place] diet consisting of high-protein and fruits/vegetables.  She notes that her soon-to-be wife has lost over 30 pounds however she is gaining weight.  She does note that she has polycystic ovarian syndrome which can be increasing her weight.  She notes that [facility].  Patient notes that she is concerned that she has ADHD.  She does endorse forgetfulness and inattentive to mentally taxing task.  Provider informed patient that she would like to have her mood stabilized prior to starting medication for ADHD.  She endorsed understanding.  Today she is agreeable to increasing trazodone 50mg  to 100 mg to help manage sleep, anxiety and depression.  She will also discontinue her oral Abilify and receive her first Abilify maintainer 400 mg injection today.  She is agreeable to increasing BuSpar 10 mg to 15 mg to help manage anxiety and depression.  She will discontinue hydroxyzine as she finds it ineffective.  She will follow-up with outpatient counseling for therapy.   Visit Diagnosis:    ICD-10-CM   1. Bipolar I disorder, most recent episode mixed (HCC)  F31.60 traZODone (DESYREL) 100 MG tablet    ARIPiprazole ER (ABILIFY MAINTENA) 400 MG prefilled syringe 400 mg    ARIPiprazole ER (ABILIFY MAINTENA) 400 MG PRSY  prefilled syringe    2. Generalized anxiety disorder  F41.1 busPIRone (BUSPAR) 15 MG tablet      Past Psychiatric History: marijuana use disorder, bipolar disorder, schizophrenia, and disassociative identity disorder  Past Medical History:  Past Medical History:  Diagnosis Date   Endometriosis    Hiatal hernia    PCOS (polycystic ovarian syndrome)     Past Surgical History:  Procedure Laterality Date    ADENOIDECTOMY     ESOPHAGOGASTRODUODENOSCOPY     TONSILLECTOMY      Family Psychiatric History:  Unknown  Family History:  Family History  Problem Relation Age of Onset   Endometriosis Mother    Polycystic ovary syndrome Mother    Diabetes Father    Heart failure Father     Social History:  Social History   Socioeconomic History   Marital status: Married    Spouse name: Not on file   Number of children: Not on file   Years of education: Not on file   Highest education level: Not on file  Occupational History   Not on file  Tobacco Use   Smoking status: Every Day    Packs/day: 1.00    Pack years: 0.00    Types: Cigarettes   Smokeless tobacco: Never  Vaping Use   Vaping Use: Never used  Substance and Sexual Activity   Alcohol use: Yes   Drug use: Never   Sexual activity: Not on file  Other Topics Concern   Not on file  Social History Narrative   Not on file   Social Determinants of Health   Financial Resource Strain: Not on file  Food Insecurity: Not on file  Transportation Needs: Not on file  Physical Activity: Not on file  Stress: Not on file  Social Connections: Not on file    Allergies:  Allergies  Allergen Reactions   Iodine Anaphylaxis   Penicillins Anaphylaxis   Shellfish Allergy Anaphylaxis   Bactrim [Sulfamethoxazole-Trimethoprim] Other (See Comments)    Caused fever   Cobalt Nausea And Vomiting and Other (See Comments)    Turns skin green/causes numbness   Lamictal [Lamotrigine] Other (See Comments)    Legs and arms went numb   Levaquin [Levofloxacin] Other (See Comments)    Red man syndrome   Nickel Nausea And Vomiting and Other (See Comments)    Turns skin green/causes numbness   Trileptal [Oxcarbazepine] Other (See Comments)    Caused dizziness/fainting spells    Metabolic Disorder Labs: No results found for: HGBA1C, MPG No results found for: PROLACTIN No results found for: CHOL, TRIG, HDL, CHOLHDL, VLDL, LDLCALC No results  found for: TSH  Therapeutic Level Labs: No results found for: LITHIUM No results found for: VALPROATE No components found for:  CBMZ  Current Medications: Current Outpatient Medications  Medication Sig Dispense Refill   ARIPiprazole ER (ABILIFY MAINTENA) 400 MG PRSY prefilled syringe Inject 400 mg into the muscle every 28 (twenty-eight) days. 1 each 12   famotidine (PEPCID) 40 MG tablet Take 1 tablet (40 mg total) by mouth daily. 30 tablet 0   ibuprofen (ADVIL) 200 MG tablet Take 440 mg by mouth daily as needed (finger pain).     ondansetron (ZOFRAN ODT) 4 MG disintegrating tablet Take 1 tablet (4 mg total) by mouth every 8 (eight) hours as needed for nausea or vomiting. 20 tablet 0   propranolol (INDERAL) 40 MG tablet Take 1 tablet (40 mg total) by mouth 2 (two) times daily. 60 tablet 2   busPIRone (BUSPAR)  15 MG tablet Take 1 tablet (15 mg total) by mouth 3 (three) times daily. 90 tablet 2   traZODone (DESYREL) 100 MG tablet Take 1 tablet (100 mg total) by mouth at bedtime. 30 tablet 2   Current Facility-Administered Medications  Medication Dose Route Frequency Provider Last Rate Last Admin   ARIPiprazole ER (ABILIFY MAINTENA) 400 MG prefilled syringe 400 mg  400 mg Intramuscular Q28 days Toy Cookey E, NP   400 mg at 09/21/20 1047   ARIPiprazole ER (ABILIFY MAINTENA) 400 MG prefilled syringe 400 mg  400 mg Intramuscular Once Shanna Cisco, NP         Musculoskeletal: Strength & Muscle Tone: within normal limits Gait & Station: normal Patient leans: N/A  Psychiatric Specialty Exam: Review of Systems  Blood pressure 112/70, pulse 68, height 5\' 3"  (1.6 m), weight 269 lb (122 kg).Body mass index is 47.65 kg/m.  General Appearance: Well Groomed  Eye Contact:  Good  Speech:  Clear and Coherent and Normal Rate  Volume:  Normal  Mood:  Anxious and Depressed  Affect:  Appropriate and Congruent  Thought Process:  Coherent, Goal Directed, and Linear  Orientation:  Full  (Time, Place, and Person)  Thought Content: WDL and Logical   Suicidal Thoughts:  No  Homicidal Thoughts:  No  Memory:  Immediate;   Good Recent;   Good Remote;   Good  Judgement:  Good  Insight:  Good  Psychomotor Activity:  Normal  Concentration:  Concentration: Good and Attention Span: Good  Recall:  Good  Fund of Knowledge: Good  Language: Good  Akathisia:  No  Handed:  Right  AIMS (if indicated): not done  Assets:  Communication Skills Desire for Improvement Financial Resources/Insurance Housing Intimacy Social Support  ADL's:  Intact  Cognition: WNL  Sleep:  Fair   Screenings: GAD-7    Office Visit from 09/21/2020 in [facility] Clinical Support from 06/22/2020 in [facility]  Total GAD-7 Score 19 20      PHQ2-9    Flowsheet Row Office Visit from 09/21/2020 in [facility] Counselor from 06/20/2020 in [facility]  PHQ-2 Total Score 0 3  PHQ-9 Total Score 17 19      Flowsheet Row Office Visit from 09/21/2020 in [facility] Clinical Support from 06/22/2020 in [facility] Counselor from 06/20/2020 in [facility]  C-SSRS RISK CATEGORY No Risk No Risk Low Risk        Assessment and Plan: Patient endorses symptoms of anxiety, depression, psychosis, and hypomania.  Today she is agreeable to starting Abilify maintaina that 400 mg injections.  She will receive her first shot today.  She is also agreeable to increasing trazodone 50 mg to 100 mg to help manage sleep, anxiety, and depression.  She will increase BuSpar 10 mg 3 times daily to 15 mg 3 times daily to help manage anxiety and depression.  At this time she notes she wants to discontinue hydroxyzine.  1. Bipolar I disorder, most recent episode mixed (HCC)  Increases- traZODone (DESYREL) 100 MG tablet;  Take 1 tablet (100 mg total) by mouth at bedtime.  Dispense: 30 tablet; Refill: 2 Start- ARIPiprazole ER (ABILIFY MAINTENA) 400 MG prefilled syringe 400 mg Start- ARIPiprazole ER (ABILIFY MAINTENA) 400 MG PRSY prefilled syringe; Inject 400 mg into the muscle every 28 (twenty-eight) days.  Dispense: 1 each; Refill: 12  2. Generalized anxiety disorder  Increased- busPIRone (BUSPAR) 15 MG tablet; Take 1 tablet (15 mg total) by mouth 3 (three) times daily.  Dispense: 90 tablet; Refill: 2   Follow-up in 3 months Follow-up with therapy Shanna Cisco, NP 09/21/2020, 11:13 AM

## 2020-10-04 ENCOUNTER — Other Ambulatory Visit: Payer: Self-pay

## 2020-10-04 ENCOUNTER — Encounter (HOSPITAL_COMMUNITY): Payer: Self-pay

## 2020-10-04 ENCOUNTER — Emergency Department (HOSPITAL_COMMUNITY)
Admission: EM | Admit: 2020-10-04 | Discharge: 2020-10-04 | Disposition: A | Discharge status: Home / Self Care | Payer: Self-pay | Attending: Emergency Medicine | Admitting: Emergency Medicine

## 2020-10-04 DIAGNOSIS — Z5321 Procedure and treatment not carried out due to patient leaving prior to being seen by health care provider: Secondary | ICD-10-CM | POA: Insufficient documentation

## 2020-10-04 DIAGNOSIS — N939 Abnormal uterine and vaginal bleeding, unspecified: Secondary | ICD-10-CM | POA: Insufficient documentation

## 2020-10-04 NOTE — ED Provider Notes (Signed)
Emergency Medicine Provider Triage Evaluation Note  Erica Rivers , a 25 y.o. female  was evaluated in triage.  Pt complains of vaginal bleeding for 9 days, states that she has been soaking through a super tampon every 30 minutes.  Patient reports history of PCOS and endometriosis, originally was not having periods but had started birth control years ago and started to have normal menses.  She denies any prior heavy bleeding.  She states she is passing large clots.  Denies any dizziness or weakness.  No shortness of breath.  This is never happened in the past.  Patient states that her wife told her to come and be evaluated, but she does state that she has a husband that she has not had vaginal sex with within the last 3 months.  Review of Systems  Positive: As above Negative: As above  Physical Exam  There were no vitals taken for this visit. Gen:   Awake, no distress   Resp:  Normal effort  MSK:   Moves extremities without difficulty  Other:    Medical Decision Making  Medically screening exam initiated at 6:36 PM.  Appropriate orders placed.  Lennie Dunnigan was informed that the remainder of the evaluation will be completed by another provider, this initial triage assessment does not replace that evaluation, and the importance of remaining in the ED until their evaluation is complete.     Leone Brand 10/04/20 1840    Cheryll Cockayne, MD 10/13/20 [phone]

## 2020-10-04 NOTE — ED Triage Notes (Signed)
Patient reports she is on day 9 of menstrual cycle and it is heavier and longer than normal. Patient has gone throw super tampon within 30 minutes.   C/o fatigue   A/Ox4 Ambulatory in triage.   Denies abdominal pain.

## 2020-10-12 ENCOUNTER — Ambulatory Visit: Payer: Medicaid Other | Attending: Family Medicine | Admitting: Family Medicine

## 2020-10-12 ENCOUNTER — Other Ambulatory Visit: Payer: Self-pay

## 2020-10-18 ENCOUNTER — Other Ambulatory Visit: Payer: Self-pay

## 2020-10-18 ENCOUNTER — Ambulatory Visit (INDEPENDENT_AMBULATORY_CARE_PROVIDER_SITE_OTHER): Payer: No Payment, Other | Admitting: Licensed Clinical Social Worker

## 2020-10-18 DIAGNOSIS — F411 Generalized anxiety disorder: Secondary | ICD-10-CM | POA: Diagnosis not present

## 2020-10-19 ENCOUNTER — Ambulatory Visit (HOSPITAL_COMMUNITY): Payer: No Payment, Other | Admitting: *Deleted

## 2020-10-19 ENCOUNTER — Encounter (HOSPITAL_COMMUNITY): Payer: Self-pay

## 2020-10-19 VITALS — BP 113/76 | HR 100 | Ht 63.0 in | Wt 262.0 lb

## 2020-10-19 DIAGNOSIS — F3160 Bipolar disorder, current episode mixed, unspecified: Secondary | ICD-10-CM

## 2020-10-19 NOTE — Progress Notes (Signed)
In as scheduled for her second Abilify M 400 mg injection. She has a good personal appearance, pleasant and verbal. Shared some information re her spouses today. She said her first shot went well and has no complaints to offer. She is planning to return home this am and go to bed. She got her shot in her L DELTOID this am without difficulty. To return in one month.

## 2020-10-21 NOTE — Progress Notes (Signed)
   THERAPIST PROGRESS NOTE  Session Time: 45 min  Participation Level: Active  Behavioral Response: CasualAlertAnxious  Type of Therapy: Individual Therapy  Treatment Goals addressed: Anxiety and Coping  Interventions: Solution Focused, Supportive, and Reframing  Summary: Erica Rivers is a 25 y.o. female who presents with hx of dep/anx/ptsd. This date pt returns for in person session. Last seen 08/17/20. Pt has her "wife", [name], with her and wants her to come in to session, which LCSW allows only briefly. Pt reports "a lot" is going on. She states she had to go to the ER recently as she had excessive vaginal bleeding. Pt states she was taken by [name] and [employer]. She states she ended up lying to them about being d/c when she actually left AMA. She confessed later. [name] allowed to comment re her concerns and states she now has trust issues with pt and this has hurt their relationship. LCSW acknowledged concerns and escorted [name] back to lobby.Most of session devoted to processing this situation and how pt may address it with both [place] and [place]. Pt states she feels [name] is "a lost cause" as he has said he may not be able to move forward after this. Pt reports she did not finish GED work by deadline and she is also distressed by this. She states today is her mother's birthday and this will be the first time she has not called her on her birthday. LCSW acknowledges feelings of loss. Pt reports she is taking meds as prescribed and pleased she has changed to injections. She feels she is improved with adjustment in dosing. Pt denies hallucinations. Reports she continues to note her self talk. 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 LCSW reviewed poc including scheduling prior to close of session. Pt states appreciation for care.     Suicidal/Homicidal: Nowithout intent/plan  Therapist Response: Pt remains mostly responsive  to care.  Plan: Return again in ~3 weeks.  Diagnosis: Axis I: Generalized Anxiety Disorder  [place] Sink, LCSW 10/21/2020

## 2020-11-01 ENCOUNTER — Other Ambulatory Visit (HOSPITAL_COMMUNITY): Payer: Self-pay | Admitting: Psychiatry

## 2020-11-01 ENCOUNTER — Telehealth (HOSPITAL_COMMUNITY): Payer: Self-pay | Admitting: *Deleted

## 2020-11-01 DIAGNOSIS — F3160 Bipolar disorder, current episode mixed, unspecified: Secondary | ICD-10-CM

## 2020-11-01 MED ORDER — DIVALPROEX SODIUM 500 MG PO DR TAB: 500.0000 mg | DELAYED_RELEASE_TABLET | Freq: Two times a day (BID) | ORAL | 3 refills | Status: DC

## 2020-11-01 NOTE — Telephone Encounter (Signed)
Call back to patient at [facility] DNPs request. Presented to her Brittneys suggestion for her to try Depakote to manage her sx of reported mania and depression. She is open to this. Will notify provider to call it in to her preferred pharmacy which is [facility] on [address].

## 2020-11-01 NOTE — Telephone Encounter (Signed)
Writer attempted to call patient twice without success.  Provider did fill Depakote 500 mg to be taken twice daily.  Provider left patient a phone message informing patient of the side effects that could occur with Depakote.  Provider informed patient that if she has side effects she can call the clinic.  Provider also informed patient of walk-in hours on Mondays and Tuesdays between the hours of 8 AM and 10 AM.  No other concerns noted at this time.

## 2020-11-01 NOTE — Telephone Encounter (Signed)
Call from patient stating she would like to speak with her provider because she does not think her medicine is working. She reported she is manic and depressed, her thinking is not clear she is having SI thoughts. Will bring this concern to Brittneys attention and ask that she contact patient. She has an appt with patient on 9/7 and her next injection is due on 8/11.

## 2020-11-08 ENCOUNTER — Other Ambulatory Visit: Payer: Self-pay

## 2020-11-08 ENCOUNTER — Ambulatory Visit (HOSPITAL_COMMUNITY): Payer: No Payment, Other | Admitting: Licensed Clinical Social Worker

## 2020-11-09 ENCOUNTER — Other Ambulatory Visit: Payer: Self-pay

## 2020-11-09 ENCOUNTER — Encounter (HOSPITAL_COMMUNITY): Payer: Self-pay | Admitting: *Deleted

## 2020-11-09 ENCOUNTER — Emergency Department (HOSPITAL_COMMUNITY): Payer: Self-pay

## 2020-11-09 ENCOUNTER — Emergency Department (HOSPITAL_COMMUNITY)
Admission: EM | Admit: 2020-11-09 | Discharge: 2020-11-09 | Disposition: A | Discharge status: Home / Self Care | Payer: Self-pay | Attending: Emergency Medicine | Admitting: Emergency Medicine

## 2020-11-09 DIAGNOSIS — R1011 Right upper quadrant pain: Secondary | ICD-10-CM | POA: Insufficient documentation

## 2020-11-09 DIAGNOSIS — K920 Hematemesis: Secondary | ICD-10-CM | POA: Insufficient documentation

## 2020-11-09 DIAGNOSIS — R1115 Cyclical vomiting syndrome unrelated to migraine: Secondary | ICD-10-CM

## 2020-11-09 DIAGNOSIS — F17210 Nicotine dependence, cigarettes, uncomplicated: Secondary | ICD-10-CM | POA: Insufficient documentation

## 2020-11-09 DIAGNOSIS — N9489 Other specified conditions associated with female genital organs and menstrual cycle: Secondary | ICD-10-CM | POA: Insufficient documentation

## 2020-11-09 LAB — COMPREHENSIVE METABOLIC PANEL
ALT: 17 U/L (ref 0–44)
AST: 17 U/L (ref 15–41)
Albumin: 3.4 g/dL — ABNORMAL LOW (ref 3.5–5.0)
Alkaline Phosphatase: 55 U/L (ref 38–126)
Anion gap: 7 (ref 5–15)
BUN: 16 mg/dL (ref 6–20)
CO2: 25 mmol/L (ref 22–32)
Calcium: 8.3 mg/dL — ABNORMAL LOW (ref 8.9–10.3)
Chloride: 109 mmol/L (ref 98–111)
Creatinine, Ser: 0.89 mg/dL (ref 0.44–1.00)
GFR, Estimated: >60 mL/min (ref >60)
Glucose, Bld: 108 mg/dL — ABNORMAL HIGH (ref 70–99)
Potassium: 3.4 mmol/L — ABNORMAL LOW (ref 3.5–5.1)
Sodium: 141 mmol/L (ref 135–145)
Total Bilirubin: 1.1 mg/dL (ref 0.3–1.2)
Total Protein: 5.8 g/dL — ABNORMAL LOW (ref 6.5–8.1)

## 2020-11-09 LAB — I-STAT BETA HCG BLOOD, ED (MC, WL, AP ONLY): I-stat hCG, quantitative: <5 m[IU]/mL (ref <5)

## 2020-11-09 LAB — CBC
HCT: 43.5 % (ref 36.0–46.0)
Hemoglobin: 13.8 g/dL (ref 12.0–15.0)
MCH: 29.6 pg (ref 26.0–34.0)
MCHC: 31.7 g/dL (ref 30.0–36.0)
MCV: 93.1 fL (ref 80.0–100.0)
Platelets: 322 10*3/uL (ref 150–400)
RBC: 4.67 MIL/uL (ref 3.87–5.11)
RDW: 13.3 % (ref 11.5–15.5)
WBC: 11.8 10*3/uL — ABNORMAL HIGH (ref 4.0–10.5)
nRBC: 0 % (ref 0.0–0.2)

## 2020-11-09 LAB — URINALYSIS, ROUTINE W REFLEX MICROSCOPIC
Bilirubin Urine: NEGATIVE
Glucose, UA: NEGATIVE mg/dL
Hgb urine dipstick: NEGATIVE
Ketones, ur: 5 mg/dL — AB
Leukocytes,Ua: NEGATIVE
Nitrite: NEGATIVE
Protein, ur: NEGATIVE mg/dL
Specific Gravity, Urine: 1.025 (ref 1.005–1.030)
pH: 6 (ref 5.0–8.0)

## 2020-11-09 LAB — LIPASE, BLOOD: Lipase: 42 U/L (ref 11–51)

## 2020-11-09 MED ORDER — PROMETHAZINE HCL 25 MG PO TABS: 25.0000 mg | ORAL_TABLET | Freq: Four times a day (QID) | ORAL | 0 refills | Status: AC | PRN

## 2020-11-09 MED ORDER — FAMOTIDINE IN NACL 20-0.9 MG/50ML-% IV SOLN
20.0000 mg | Freq: Once | INTRAVENOUS | Status: AC
  Administered 2020-11-09: 20 mg via INTRAVENOUS
  Filled 2020-11-09: qty 50

## 2020-11-09 MED ORDER — FAMOTIDINE 20 MG PO TABS: 20.0000 mg | ORAL_TABLET | Freq: Two times a day (BID) | ORAL | 0 refills | Status: AC

## 2020-11-09 MED ORDER — SODIUM CHLORIDE 0.9 % IV SOLN
25.0000 mg | Freq: Four times a day (QID) | INTRAVENOUS | Status: DC | PRN
  Filled 2020-11-09: qty 1

## 2020-11-09 MED ORDER — SODIUM CHLORIDE 0.9 % IV BOLUS
1000.0000 mL | Freq: Once | INTRAVENOUS | Status: AC
  Administered 2020-11-09: 1000 mL via INTRAVENOUS

## 2020-11-09 MED ORDER — ONDANSETRON HCL 4 MG/2ML IJ SOLN
4.0000 mg | Freq: Once | INTRAMUSCULAR | Status: AC
  Administered 2020-11-09: 4 mg via INTRAVENOUS
  Filled 2020-11-09: qty 2

## 2020-11-09 NOTE — ED Triage Notes (Signed)
Py dtstrd dh has been throwing up on and off for about a year. Last emesis yesterday, nausea today, some blood tinged emesis with noted more blood at intervals. Pain noted in rt flank/abd mid location.

## 2020-11-09 NOTE — ED Notes (Signed)
US at bedside

## 2020-11-09 NOTE — ED Notes (Signed)
ED Provider at bedside. 

## 2020-11-09 NOTE — Discharge Instructions (Signed)
  You were evaluated in the Emergency Department and after careful evaluation, we did not find any emergent condition requiring admission or further testing in the hospital.   Your exam/testing today was overall reassuring.  Symptoms seem to be due to cyclical vomiting, you can read about this in the attached instructions.  I did prescribe you some Phenergan.  As we discussed I want you to start taking Pepcid, I did prescribe this for you as well.  Please follow-up with the GI doctor as we discussed, you have any new or worsening concerning symptom please come back to the emergency department.  If you continue to vomit large amounts of blood please come back to the emergency department. Please return to the Emergency Department if you experience any worsening of your condition.  Thank you for allowing [place] to be a part of your care. Please speak to your pharmacist about any new medications prescribed today in regards to side effects or interactions with other medications.

## 2020-11-09 NOTE — ED Provider Notes (Signed)
Patient is [facility]-EMERGENCY DEPT Provider Note   CSN: [id number] Arrival date & time: 11/09/20  2536     History Chief Complaint  Patient presents with   Hemoptysis   Nausea    Erica Rivers is a 25 y.o. female  with a reported history of endometriosis, hiatal hernia, ulcers, PCOS, seizures(not on any medications) that presents emerged department today for hematemesis.  Dates that she has had episodes of hematemesis since 2018, states that she has an episode of hematemesis daily since then.  Describes this as streaks of blood in her vomit.  Patient states that she has had an endoscopy in 2019 in [place] and was diagnosed with hiatal hernia.  Has not been able to follow-up with GI while in [place].  Per chart review patient was last seen here in March of this year for similar complaint, at that time had negative CT scan done.  At this time patient denies any melena or bloody stools.  Does admit to some abdominal pain on the right upper quadrant that does radiate to the left upper quadrant at times.  Patient states that the right upper quadrant pain is intermittent, comes and goes, states that she is never had a ultrasound done before of her gallbladder.  Denies any abdominal surgeries.  Denies any fevers, URI symptoms.  Denies any cough or hemoptysis.  Denies any chest pain or shortness of breath.  Patient states that at times she will vomit a cup of bright red blood, however has not this week.  Reports that she is here because she is unable to keep anything down for the past week.  States that she is able to tolerate fluids, however cannot keep any solid foods down.  Reports diarrhea, states that she has had this for the past multiple years, stools are normal for her.  Denies any bloody stools.  Denies any substance use.  Denies any chance of pregnancy or vaginal complaints.  Denies any urinary complaints.  HPI     Past Medical History:  Diagnosis Date    Endometriosis    Hiatal hernia    PCOS (polycystic ovarian syndrome)     Patient Active Problem List   Diagnosis Date Noted   Bipolar I disorder, most recent episode mixed (HCC) 06/22/2020   Generalized anxiety disorder 06/22/2020   PTSD (post-traumatic stress disorder) 06/22/2020    Past Surgical History:  Procedure Laterality Date   ADENOIDECTOMY     ESOPHAGOGASTRODUODENOSCOPY     TONSILLECTOMY       OB History   No obstetric history on file.     Family History  Problem Relation Age of Onset   Endometriosis Mother    Polycystic ovary syndrome Mother    Diabetes Father    Heart failure Father     Social History   Tobacco Use   Smoking status: Every Day    Packs/day: 1.00    Types: Cigarettes   Smokeless tobacco: Never  Vaping Use   Vaping Use: Never used  Substance Use Topics   Alcohol use: Yes   Drug use: Never    Home Medications Prior to Admission medications   Medication Sig Start Date End Date Taking? Authorizing Provider  ARIPiprazole ER (ABILIFY MAINTENA) 400 MG PRSY prefilled syringe Inject 400 mg into the muscle every 28 (twenty-eight) days. 09/21/20   Shanna Cisco, NP  busPIRone (BUSPAR) 15 MG tablet Take 1 tablet (15 mg total) by mouth 3 (three) times daily. 09/21/20  Toy CookeyParsons, Brittney E, NP  divalproex (DEPAKOTE) 500 MG DR tablet Take 1 tablet (500 mg total) by mouth 2 (two) times daily. 11/01/20   Shanna CiscoParsons, Brittney E, NP  famotidine (PEPCID) 20 MG tablet Take 1 tablet (20 mg total) by mouth 2 (two) times daily. 11/09/20  Yes Farrel GordonPatel, Kasy Iannacone, PA-C  famotidine (PEPCID) 40 MG tablet Take 1 tablet (40 mg total) by mouth daily. 06/11/20   Haskel SchroederBadalamente, Peter R, PA-C  ibuprofen (ADVIL) 200 MG tablet Take 440 mg by mouth daily as needed (finger pain).    [provider]  NORTREL 0.5/35, 28, 0.5-35 MG-MCG tablet Take 1 tablet by mouth daily. 09/12/20   [provider]  ondansetron (ZOFRAN ODT) 4 MG disintegrating tablet Take 1 tablet (4 mg  total) by mouth every 8 (eight) hours as needed for nausea or vomiting. 06/11/20   Haskel SchroederBadalamente, Peter R, PA-C  promethazine (PHENERGAN) 25 MG tablet Take 1 tablet (25 mg total) by mouth every 6 (six) hours as needed for nausea or vomiting. 11/09/20  Yes Farrel GordonPatel, Wake Conlee, PA-C  propranolol (INDERAL) 40 MG tablet Take 1 tablet (40 mg total) by mouth 2 (two) times daily. 06/27/20   Shanna CiscoParsons, Brittney E, NP  traZODone (DESYREL) 100 MG tablet Take 1 tablet (100 mg total) by mouth at bedtime. 09/21/20   Shanna CiscoParsons, Brittney E, NP    Allergies    Iodine, Penicillins, Shellfish allergy, Bactrim [sulfamethoxazole-trimethoprim], Cobalt, Lamictal [lamotrigine], Levaquin [levofloxacin], Nickel, and Trileptal [oxcarbazepine]  Review of Systems   Review of Systems  Constitutional:  Negative for chills, diaphoresis, fatigue and fever.  HENT:  Negative for congestion, sore throat and trouble swallowing.   Eyes:  Negative for pain and visual disturbance.  Respiratory:  Negative for cough, shortness of breath and wheezing.   Cardiovascular:  Negative for chest pain, palpitations and leg swelling.  Gastrointestinal:  Positive for abdominal pain, diarrhea, nausea and vomiting. Negative for abdominal distention, anal bleeding, blood in stool and constipation.  Genitourinary:  Negative for difficulty urinating.  Musculoskeletal:  Negative for back pain, neck pain and neck stiffness.  Skin:  Negative for pallor.  Neurological:  Negative for dizziness, speech difficulty, weakness and headaches.  Psychiatric/Behavioral:  Negative for confusion.    Physical Exam Updated Vital Signs BP 130/82 (BP Location: Left Arm)   Pulse 72   Temp 97.8 F (36.6 C) (Oral)   Resp 16   Ht 5\' 3"  (1.6 m)   Wt 120.7 kg   SpO2 97%   BMI 47.12 kg/m   Physical Exam Constitutional:      General: She is not in acute distress.    Appearance: Normal appearance. She is not ill-appearing, toxic-appearing or diaphoretic.  HENT:      Mouth/Throat:     Mouth: Mucous membranes are moist.     Pharynx: Oropharynx is clear.  Eyes:     General: No scleral icterus.    Extraocular Movements: Extraocular movements intact.     Pupils: Pupils are equal, round, and reactive to light.  Cardiovascular:     Rate and Rhythm: Normal rate and regular rhythm.     Pulses: Normal pulses.     Heart sounds: Normal heart sounds.  Pulmonary:     Effort: Pulmonary effort is normal. No respiratory distress.     Breath sounds: Normal breath sounds. No stridor. No wheezing, rhonchi or rales.  Chest:     Chest wall: No tenderness.  Abdominal:     General: Abdomen is flat. There is no distension.  Palpations: Abdomen is soft.     Tenderness: There is abdominal tenderness in the right upper quadrant and epigastric area. There is no right CVA tenderness, left CVA tenderness, guarding or rebound. Negative signs include Murphy's sign and McBurney's sign.  Musculoskeletal:        General: No swelling or tenderness. Normal range of motion.     Cervical back: Normal range of motion and neck supple. No rigidity.     Right lower leg: No edema.     Left lower leg: No edema.  Skin:    General: Skin is warm and dry.     Capillary Refill: Capillary refill takes less than 2 seconds.     Coloration: Skin is not pale.  Neurological:     General: No focal deficit present.     Mental Status: She is alert and oriented to person, place, and time.  Psychiatric:        Mood and Affect: Mood normal.        Behavior: Behavior normal.    ED Results / Procedures / Treatments   Labs (all labs ordered are listed, but only abnormal results are displayed) Labs Reviewed  COMPREHENSIVE METABOLIC PANEL - Abnormal; Notable for the following components:      Result Value   Potassium 3.4 (*)    Glucose, Bld 108 (*)    Calcium 8.3 (*)    Total Protein 5.8 (*)    Albumin 3.4 (*)    All other components within normal limits  CBC - Abnormal; Notable for the  following components:   WBC 11.8 (*)    All other components within normal limits  URINALYSIS, ROUTINE W REFLEX MICROSCOPIC - Abnormal; Notable for the following components:   APPearance HAZY (*)    Ketones, ur 5 (*)    All other components within normal limits  LIPASE, BLOOD  I-STAT BETA HCG BLOOD, ED ([facility], [facility], [facility] ONLY)    EKG None  Radiology CT ABDOMEN PELVIS WO CONTRAST  Result Date: 11/09/2020 CLINICAL DATA:  Epigastric abdominal and right flank pain. Hematemesis. EXAM: CT ABDOMEN AND PELVIS WITHOUT CONTRAST TECHNIQUE: Multidetector CT imaging of the abdomen and pelvis was performed following the standard protocol without IV contrast. COMPARISON:  06/11/2020 FINDINGS: Lower chest: No acute findings. Hepatobiliary: No mass visualized on this unenhanced exam. Gallbladder is unremarkable. No evidence of biliary ductal dilatation. Pancreas: No mass or inflammatory process visualized on this unenhanced exam. Spleen:  Within normal limits in size. Adrenals/Urinary tract: No evidence of urolithiasis or hydronephrosis. Unremarkable unopacified urinary bladder. Stomach/Bowel: No evidence of obstruction, inflammatory process, or abnormal fluid collections. Normal appendix visualized. Vascular/Lymphatic: No pathologically enlarged lymph nodes identified. No evidence of abdominal aortic aneurysm. Reproductive:  No mass or other significant abnormality. Other:  None. Musculoskeletal:  No suspicious bone lesions identified. IMPRESSION: Negative. No evidence of urolithiasis, hydronephrosis, or other acute findings. Electronically Signed   By: Danae Orleans M.D.   On: 11/09/2020 10:22   US Abdomen Limited RUQ (LIVER/GB)  Result Date: 11/09/2020 CLINICAL DATA:  Nausea and vomiting for 1 year EXAM: ULTRASOUND ABDOMEN LIMITED RIGHT UPPER QUADRANT COMPARISON:  CT abdomen pelvis dated June 11, 2020 FINDINGS: Gallbladder: Gallbladder wall measures up to 4 mm, apparent mild wall thickening is likely related to  gallbladder wall contraction. No gallstones visualized. No sonographic Murphy sign noted by sonographer. Common bile duct: Diameter: 2 mm Liver: No focal lesion identified. Within normal limits in parenchymal echogenicity. Portal vein is patent on color Doppler imaging with  normal direction of blood flow towards the liver. Other: None. IMPRESSION: Gallbladder is contracted. Within these limitations no gallstones are visualized. Electronically Signed   By: Allegra Lai MD   On: 11/09/2020 08:35    Procedures Procedures   Medications Ordered in ED Medications  promethazine (PHENERGAN) 25 mg in sodium chloride 0.9 % 50 mL IVPB (has no administration in time range)  sodium chloride 0.9 % bolus 1,000 mL (0 mLs Intravenous Stopped 11/09/20 1050)  ondansetron (ZOFRAN) injection 4 mg (4 mg Intravenous Given 11/09/20 0755)  famotidine (PEPCID) IVPB 20 mg premix (0 mg Intravenous Stopped 11/09/20 1050)    ED Course  I have reviewed the triage vital signs and the nursing notes.  Pertinent labs & imaging results that were available during my care of the patient were reviewed by me and considered in my medical decision making (see chart for details).    MDM Rules/Calculators/A&P                           Erica Rivers is a 25 y.o. female  with a reported history of endometriosis, hiatal hernia, ulcers, PCOS, seizures(not on any medications) that presents emerged department today for hematemesis.  Patient is having some abdominal tenderness to right upper quadrant, differential to include cyclical vomiting syndrome, cholelithiasis.  Patient appears well, nontoxic appearing.  Initial interventions include fluids, Zofran and Pepcid.  Low concerns for PE, patient without shortness of breath chest pain and is not having hemoptysis, or other hematemesis.  Hematemesis most likely from streaking from micro esophageal tears.  Work-up today with unremarkable right upper quadrant ultrasound and CT abdomen pelvis.  Lab  work today unremarkable as well.  Patient with normal urinalysis.  Upon reevaluation patient states that she feels much better, no longer having pain.  Patient is tolerating fluids and eating crackers at this time.  Patient to be discharged and follow-up with GI doctor.  Doubt need for further emergent work up at this time. I explained the diagnosis and have given explicit precautions to return to the ER including for any other new or worsening symptoms. The patient understands and accepts the medical plan as it's been dictated and I have answered their questions. Discharge instructions concerning home care and prescriptions have been given. The patient is STABLE and is discharged to home in good condition.   Final Clinical Impression(s) / ED Diagnoses Final diagnoses:  RUQ pain  Cyclical vomiting  Hematemesis with nausea    Rx / DC Orders ED Discharge Orders          Ordered    promethazine (PHENERGAN) 25 MG tablet  Every 6 hours PRN        11/09/20 1215    famotidine (PEPCID) 20 MG tablet  2 times daily        11/09/20 1215             Farrel Gordon, PA-C 11/09/20 1218    Rolan Bucco, MD 11/09/20 1352

## 2020-11-16 ENCOUNTER — Other Ambulatory Visit: Payer: Self-pay

## 2020-11-16 ENCOUNTER — Encounter (HOSPITAL_COMMUNITY): Payer: Self-pay

## 2020-11-16 ENCOUNTER — Ambulatory Visit (INDEPENDENT_AMBULATORY_CARE_PROVIDER_SITE_OTHER): Payer: No Payment, Other | Admitting: *Deleted

## 2020-11-16 VITALS — BP 125/76 | HR 86 | Ht 63.0 in | Wt 267.0 lb

## 2020-11-16 DIAGNOSIS — F3160 Bipolar disorder, current episode mixed, unspecified: Secondary | ICD-10-CM | POA: Diagnosis not present

## 2020-11-16 NOTE — Progress Notes (Signed)
PATIENT ARRIVED WITH HER WIFE FOR HER INJECTION THIS MORNING. PATIENT TOLERATED ARIPiprazole ER (ABILIFY MAINTENA) 400 MG. TOLERATED WELL IN RIGHT-ARM. DID SHARE DAUGHTER IS GOING TO 1ST GRADE THIS SCHOOL YEAR WITH A [employer] PI[employer]URE. NO HI/SI NOR AH VH

## 2020-11-28 ENCOUNTER — Encounter (HOSPITAL_COMMUNITY): Payer: Self-pay | Admitting: Licensed Clinical Social Worker

## 2020-11-28 ENCOUNTER — Other Ambulatory Visit: Payer: Self-pay

## 2020-11-28 ENCOUNTER — Ambulatory Visit (INDEPENDENT_AMBULATORY_CARE_PROVIDER_SITE_OTHER): Payer: No Payment, Other | Admitting: Licensed Clinical Social Worker

## 2020-11-28 DIAGNOSIS — F411 Generalized anxiety disorder: Secondary | ICD-10-CM

## 2020-11-28 DIAGNOSIS — F3160 Bipolar disorder, current episode mixed, unspecified: Secondary | ICD-10-CM | POA: Diagnosis not present

## 2020-11-28 NOTE — Progress Notes (Signed)
   THERAPIST PROGRESS NOTE  Session Time: 44  Participation Level: Active  Behavioral Response: CasualAlertAnxious  Type of Therapy: Individual Therapy  Treatment Goals addressed: Anxiety and Diagnosis: depression   Interventions: CBT and Supportive  Summary: Erica Rivers is a 25 y.o. female who presents with depressed and anxious mood\affect.  Erica Rivers was alert and oriented x5.  She presented today as pleasant, cooperative, and she maintained good eye contact.  Patient comes in today as a transfer to this LCSW.  LCSW inquired about barriers to previous therapist.  Erica Rivers reported communication issues with previous therapist stating "the reason I came in originally was because of my and everything was redirected in that direction, but I have a lot of other things that I want to address ".  Other barriers reported by patient's were times of session patient states that sessions were cut off 25 to 30 minutes into a 1 hour appointment.Marland Kitchen  LCSW did address this with patient stating to patient clear boundaries and expectations by this LCSW that most sessions will last about 45 minutes but can go up to 1 hour.  Erica Rivers states that there were times that she was taken back into her appointments 15 minutes late and that this was also a problem for her.  LCSW was agreeable to take patient back within 10 minutes of her arriving to appointments.  Stressors reported today by patient were financial, relationship, and family conflict.  Patient reports that she does not have a good relationship with her mother or stepfather and she no longer talks to either of them stating "they were just toxic people always talking down to me".  Other stressors include relationship patient reports that she is currently in a polyamorous relationship with 1 female husband and 1 female partner.  Main factor into fights include financial as they only make enough to pay "1 to 2 pills/month".  Suicidal/Homicidal: Yeswithout  intent/plan  Therapist Response:    Plan/Intervention: Plan for patient is to follow-up with LCSW every 4 weeks.  Patient to follow-up with homework provided to patient resources for existential therapy, person centered therapy, and primary care physician referral.  LCSW used utilized open-ended questions.  Patient used self-direction to guide therapy and direction that was conducive to patient.  LCSW used positive regard and cognitive restructuring in session today.  Plan: Return again in 6 weeks.     Weber Cooks, LCSW 11/28/2020

## 2020-12-13 ENCOUNTER — Ambulatory Visit (INDEPENDENT_AMBULATORY_CARE_PROVIDER_SITE_OTHER): Payer: No Payment, Other | Admitting: Psychiatry

## 2020-12-13 ENCOUNTER — Ambulatory Visit (HOSPITAL_COMMUNITY): Payer: No Payment, Other | Admitting: *Deleted

## 2020-12-13 ENCOUNTER — Other Ambulatory Visit: Payer: Self-pay

## 2020-12-13 ENCOUNTER — Encounter (HOSPITAL_COMMUNITY): Payer: Self-pay

## 2020-12-13 VITALS — BP 128/80 | HR 79 | Ht 63.0 in | Wt 266.0 lb

## 2020-12-13 DIAGNOSIS — F3160 Bipolar disorder, current episode mixed, unspecified: Secondary | ICD-10-CM | POA: Diagnosis not present

## 2020-12-13 DIAGNOSIS — F411 Generalized anxiety disorder: Secondary | ICD-10-CM

## 2020-12-13 MED ORDER — BUSPIRONE HCL 15 MG PO TABS: 15.0000 mg | ORAL_TABLET | Freq: Three times a day (TID) | ORAL | 3 refills | Status: AC

## 2020-12-13 MED ORDER — ABILIFY MAINTENA 400 MG IM PRSY: 400.0000 mg | PREFILLED_SYRINGE | INTRAMUSCULAR | 12 refills | Status: AC

## 2020-12-13 MED ORDER — MIRTAZAPINE 15 MG PO TABS: 15.0000 mg | ORAL_TABLET | Freq: Every day | ORAL | 3 refills | Status: AC

## 2020-12-13 MED ORDER — DIVALPROEX SODIUM 500 MG PO DR TAB: 500.0000 mg | DELAYED_RELEASE_TABLET | Freq: Three times a day (TID) | ORAL | 3 refills | Status: AC

## 2020-12-13 NOTE — Progress Notes (Signed)
BH MD/PA/NP OP Progress Note  12/13/2020 9:44 AM Erica Rivers  MRN:  [id number]  Chief Complaint: "I love my Abilify but  I am still having mood swings"   HPI: 25 year old female seen today for follow up psychiatric evaluation.  She has a psychiatric history of of marijuana use disorder, bipolar disorder, schizophrenia, and disassociative identity disorder.  She is currently managed on Abilify Maintena 400 mg monthly, trazodone 100 mg nightly as needed, and BuSpar 15 mg 3 times daily.  She notes her medications are somewhat effective in managing her psychiatric conditions.  Today she is well-groomed, pleasant, cooperative, and engaged in conversation.  She informed provider that she loves her Abilify however reports that she has been having mood swings.  She notes that when she first gets her injection she feels fine for the first 2 to 3 weeks and then finds that it wears off during week 3/4.  She notes that she has racing thoughts, fluctuations in mood, increased irritability, and anger.  She notes that when she is angered she has different personalities.  Patient informed Clinical research associate that she discontinued trazodone because it was ineffective.  She notes that she is only been sleeping 3 to 4 hours nightly.  She informed Clinical research associate that she found Depakote somewhat effective in managing her mood.   Patient informed Clinical research associate that she and her female fianc and female fianc are not getting married in September.  She did not want to discuss the logistics however notes that it is a long story.  She did Archivist that she currently has picked up a job at [employer] and is in the process of being trained for management.  She notes that she dislikes her new position however reports its needed money for the family.  She informed Clinical research associate that her her daughter started first grade however notes that she dislikes it.   Patient notes that the above stressors exacerbate her anxiety and depression.  Provider conducted a GAD-7 and  patient scored a 21, at her last visit she scored a 19.  Provider also conducted a PHQ-9 and patient scored a 24, at her last visit she scored a 17.  She endorses passive SI however denies wanting to harm her self today.  She denies SI/HI/VAH.  She notes that her appetite has been poor but denies weight loss.  Patient was recently seen in the ED for cyclic vomiting.  She notes that she has a hernia however reports that it cannot be repaired at this time because she does not have insurance.  Patient informed Clinical research associate that she has been more anxious since being off of trazodone and notes that BuSpar is not as effective as it used to be.  Today she is agreeable to starting mirtazapine 15 mg nightly to help manage sleep, anxiety, and depression.  She is also agreeable to increasing Depakote 500 mg twice daily to 500 mg 3 times daily to help stabilize mood. Potential side effects of medication and risks vs benefits of treatment vs non-treatment were explained and discussed. All questions were answered.  Provider discussed getting Depakote levels drawn in a few months.  She endorsed understanding and agreed.  She will follow-up with outpatient counseling for therapy.  No other concerns noted at this time.   Visit Diagnosis:  No diagnosis found.   Past Psychiatric History: marijuana use disorder, bipolar disorder, schizophrenia, and disassociative identity disorder  Past Medical History:  Past Medical History:  Diagnosis Date   Endometriosis    Hiatal hernia  PCOS (polycystic ovarian syndrome)     Past Surgical History:  Procedure Laterality Date   ADENOIDECTOMY     ESOPHAGOGASTRODUODENOSCOPY     TONSILLECTOMY      Family Psychiatric History:  Unknown  Family History:  Family History  Problem Relation Age of Onset   Endometriosis Mother    Polycystic ovary syndrome Mother    Diabetes Father    Heart failure Father     Social History:  Social History   Socioeconomic History   Marital  status: Married    Spouse name: Not on file   Number of children: Not on file   Years of education: Not on file   Highest education level: Not on file  Occupational History   Not on file  Tobacco Use   Smoking status: Every Day    Types: E-cigarettes   Smokeless tobacco: Never  Vaping Use   Vaping Use: Never used  Substance and Sexual Activity   Alcohol use: Yes    Comment: Occ   Drug use: Yes    Types: Marijuana    Comment: 1 x monthly   Sexual activity: Yes    Birth control/protection: None    Comment: 2 partners 1 female and 1 female  Other Topics Concern   Not on file  Social History Narrative   Not on file   Social Determinants of Health   Financial Resource Strain: High Risk   Difficulty of Paying Living Expenses: Hard  Food Insecurity: Food Insecurity Present   Worried About Programme researcher, broadcasting/film/video in the Last Year: Often true   Barista in the Last Year: Often true  Transportation Needs: No Transportation Needs   Lack of Transportation (Medical): No   Lack of Transportation (Non-Medical): No  Physical Activity: Inactive   Days of Exercise per Week: 0 days   Minutes of Exercise per Session: 0 min  Stress: Stress Concern Present   Feeling of Stress : Very much  Social Connections: Moderately Isolated   Frequency of Communication with Friends and Family: More than three times a week   Frequency of Social Gatherings with Friends and Family: Once a week   Attends Religious Services: Never   Database administrator or Organizations: No   Attends Banker Meetings: Never   Marital Status: Married    Allergies:  Allergies  Allergen Reactions   Iodine Anaphylaxis   Penicillins Anaphylaxis   Shellfish Allergy Anaphylaxis   Bactrim [Sulfamethoxazole-Trimethoprim] Other (See Comments)    Caused fever   Cobalt Nausea And Vomiting and Other (See Comments)    Turns skin green/causes numbness   Lamictal [Lamotrigine] Other (See Comments)    Legs and  arms went numb   Levaquin [Levofloxacin] Other (See Comments)    Red man syndrome   Nickel Nausea And Vomiting and Other (See Comments)    Turns skin green/causes numbness   Trileptal [Oxcarbazepine] Other (See Comments)    Caused dizziness/fainting spells    Metabolic Disorder Labs: No results found for: HGBA1C, MPG No results found for: PROLACTIN No results found for: CHOL, TRIG, HDL, CHOLHDL, VLDL, LDLCALC No results found for: TSH  Therapeutic Level Labs: No results found for: LITHIUM No results found for: VALPROATE No components found for:  CBMZ  Current Medications: Current Outpatient Medications  Medication Sig Dispense Refill   ARIPiprazole ER (ABILIFY MAINTENA) 400 MG PRSY prefilled syringe Inject 400 mg into the muscle every 28 (twenty-eight) days. 1 each 12   busPIRone (  BUSPAR) 15 MG tablet Take 1 tablet (15 mg total) by mouth 3 (three) times daily. 90 tablet 2   divalproex (DEPAKOTE) 500 MG DR tablet Take 1 tablet (500 mg total) by mouth 2 (two) times daily. 60 tablet 3   famotidine (PEPCID) 20 MG tablet Take 1 tablet (20 mg total) by mouth 2 (two) times daily. 30 tablet 0   famotidine (PEPCID) 40 MG tablet Take 1 tablet (40 mg total) by mouth daily. 30 tablet 0   ibuprofen (ADVIL) 200 MG tablet Take 440 mg by mouth daily as needed (finger pain).     NORTREL 0.5/35, 28, 0.5-35 MG-MCG tablet Take 1 tablet by mouth daily.     ondansetron (ZOFRAN ODT) 4 MG disintegrating tablet Take 1 tablet (4 mg total) by mouth every 8 (eight) hours as needed for nausea or vomiting. 20 tablet 0   promethazine (PHENERGAN) 25 MG tablet Take 1 tablet (25 mg total) by mouth every 6 (six) hours as needed for nausea or vomiting. 30 tablet 0   propranolol (INDERAL) 40 MG tablet Take 1 tablet (40 mg total) by mouth 2 (two) times daily. 60 tablet 2   traZODone (DESYREL) 100 MG tablet Take 1 tablet (100 mg total) by mouth at bedtime. 30 tablet 2   Current Facility-Administered Medications   Medication Dose Route Frequency Provider Last Rate Last Admin   ARIPiprazole ER (ABILIFY MAINTENA) 400 MG prefilled syringe 400 mg  400 mg Intramuscular Q28 days Toy CookeyParsons, Joeleen Wortley E, NP   400 mg at 12/13/20 1100   ARIPiprazole ER (ABILIFY MAINTENA) 400 MG prefilled syringe 400 mg  400 mg Intramuscular Once Shanna CiscoParsons, Jaliana Medellin E, NP         Musculoskeletal: Strength & Muscle Tone: within normal limits Gait & Station: normal Patient leans: N/A  Psychiatric Specialty Exam: Review of Systems  There were no vitals taken for this visit.There is no height or weight on file to calculate BMI.  General Appearance: Well Groomed  Eye Contact:  Good  Speech:  Clear and Coherent and Normal Rate  Volume:  Normal  Mood:  Anxious and Depressed  Affect:  Appropriate and Congruent  Thought Process:  Coherent, Goal Directed, and Linear  Orientation:  Full (Time, Place, and Person)  Thought Content: WDL and Logical   Suicidal Thoughts:  No  Homicidal Thoughts:  No  Memory:  Immediate;   Good Recent;   Good Remote;   Good  Judgement:  Good  Insight:  Good  Psychomotor Activity:  Normal  Concentration:  Concentration: Good and Attention Span: Good  Recall:  Good  Fund of Knowledge: Good  Language: Good  Akathisia:  No  Handed:  Right  AIMS (if indicated): not done  Assets:  Communication Skills Desire for Improvement Financial Resources/Insurance Housing Intimacy Social Support  ADL's:  Intact  Cognition: WNL  Sleep:  Fair   Screenings: AUDIT    Advertising copywriterlowsheet Row Counsel[facility]  Alcohol Use Disorder Identification Test Final Score (AUDIT) 3      GAD-7    Flowsheet Row Counsel[facility]  Total GAD-7 Score 20 19 20       PHQ2-9     Flow[facility] Counselor from 06/20/2020 in  [facility]  PHQ-2 Total Score 6 0 3  PHQ-9 Total Score 26 17 19       Flowsheet Row Counselor from 11/28/2020 in [facility] ED from 11/09/2020 in [facility] DEPT ED from 10/04/2020 in [facility]  C-SSRS RISK CATEGORY Low Risk Low Risk No Risk        Assessment and Plan: Patient endorses symptoms of anxiety, depression, and hypomania.  Patient informed 10/06/2020 that she has been more anxious since being off of trazodone and notes that BuSpar is not as effective as it used to be.  Today she is agreeable to starting mirtazapine 15 mg nightly to help manage sleep, anxiety, and depression.  She is also agreeable to increasing Depakote 500 mg twice daily to 500 mg 3 times daily to help stabilize mood. Potential side effects of medication and risks vs benefits of treatment vs non-treatment were explained and discussed. All questions were answered.  Provider discussed getting Depakote levels drawn in a few months.  She endorsed understanding and agreed.   1. Bipolar I disorder, most recent episode mixed (HCC)  Continue- ARIPiprazole ER (ABILIFY MAINTENA) 400 MG PRSY prefilled syringe; Inject 400 mg into the muscle every 28 (twenty-eight) days.  Dispense: 1 each; Refill: 12 Increased - divalproex (DEPAKOTE) 500 MG DR tablet; Take 1 tablet (500 mg total) by mouth 3 (three) times daily.  Dispense: 90 tablet; Refill: 3  2. Generalized anxiety disorder  Continue- busPIRone (BUSPAR) 15 MG tablet; Take 1 tablet (15 mg total) by mouth 3 (three) times daily.  Dispense: 90 tablet; Refill: 3 Start- mirtazapine (REMERON) 15 MG tablet; Take 1 tablet (15 mg total) by mouth at bedtime.  Dispense: 30 tablet; Refill:  3   Follow-up in 3 months Follow-up with therapy Clinical research associate, NP 12/13/2020, 9:44 AM

## 2020-12-13 NOTE — Progress Notes (Signed)
In as planned for her monthly injection of Abilify M 400mg , given today in her L DELTOID. She states her daughter is having dfficulty adjusting to her school, she doesn't like change. Also, her 23 yo daughter who is her wifes sister, patient calls her daughter they just got custody of her and she will be coming to live with them in the near future. She is also working nights which is putting a strain on her and causing her to lose sleep. Discussed the value of the job if she is missing family time, losing sleep and working from 11 p 5 am 5-6 days a week. States her husband is an "ass hole" but no specific complaints, states that is the way he is. She denies any problems with psychotic sx and not dangerous to others. Only complaint is poor sleep. She has an appt this am with the provider. Schedule back in one month for her next injection.

## 2020-12-14 ENCOUNTER — Ambulatory Visit (HOSPITAL_COMMUNITY): Payer: No Payment, Other

## 2020-12-19 ENCOUNTER — Ambulatory Visit (HOSPITAL_COMMUNITY): Payer: No Payment, Other | Admitting: Licensed Clinical Social Worker

## 2020-12-20 ENCOUNTER — Emergency Department (HOSPITAL_COMMUNITY)
Admission: EM | Admit: 2020-12-20 | Discharge: 2020-12-20 | Disposition: A | Discharge status: Home / Self Care | Payer: Medicaid Other | Attending: Emergency Medicine | Admitting: Emergency Medicine

## 2020-12-20 ENCOUNTER — Other Ambulatory Visit: Payer: Self-pay

## 2020-12-20 ENCOUNTER — Encounter (HOSPITAL_COMMUNITY): Payer: Self-pay

## 2020-12-20 DIAGNOSIS — F17290 Nicotine dependence, other tobacco product, uncomplicated: Secondary | ICD-10-CM | POA: Insufficient documentation

## 2020-12-20 DIAGNOSIS — R0981 Nasal congestion: Secondary | ICD-10-CM | POA: Insufficient documentation

## 2020-12-20 DIAGNOSIS — Z8616 Personal history of COVID-19: Secondary | ICD-10-CM | POA: Insufficient documentation

## 2020-12-20 DIAGNOSIS — R059 Cough, unspecified: Secondary | ICD-10-CM | POA: Insufficient documentation

## 2020-12-20 DIAGNOSIS — J029 Acute pharyngitis, unspecified: Secondary | ICD-10-CM | POA: Insufficient documentation

## 2020-12-20 DIAGNOSIS — Z20822 Contact with and (suspected) exposure to covid-19: Secondary | ICD-10-CM | POA: Insufficient documentation

## 2020-12-20 LAB — RESP PANEL BY RT-PCR (FLU A&B, COVID) ARPGX2
Influenza A by PCR: NEGATIVE
Influenza B by PCR: NEGATIVE
SARS Coronavirus 2 by RT PCR: NEGATIVE

## 2020-12-20 LAB — GROUP A STREP BY PCR: Group A Strep by PCR: NOT DETECTED

## 2020-12-20 NOTE — ED Triage Notes (Signed)
Pt arrived via POV, c/o cough and sore throat x1 week. States daughter recently had strep.

## 2020-12-20 NOTE — ED Notes (Signed)
An After Visit Summary was printed and given to the patient. Discharge instructions given and no further questions at this time.  

## 2020-12-20 NOTE — ED Provider Notes (Signed)
[facility] Provider Note   CSN: [id number] Arrival date & time: 12/20/20  0938     History Chief Complaint  Patient presents with   Cough    Erica Rivers is a 25 y.o. female.  26 year old female with prior medical history as detailed below presents for evaluation.  Patient reports mild cough, congestion, and sore throat.  Patient reports that she started taking amoxicillin 5 days prior for treatment of same.  The amoxicillin was from a family member.  She does have enough amoxicillin for 7-day course.  She reports that one of her household family members was diagnosed with strep throat last week.  She is convinced that she most likely has strep throat.  Her job has advised her to obtain a COVID test before she can return to work.  She is currently requesting a COVID screening test.  She reports prior COVID infection approximately 1-1/2 years ago.  The history is provided by the patient.  Cough Cough characteristics:  Dry Sputum characteristics:  Nondescript Severity:  Mild Onset quality:  Gradual Duration:  6 days Timing:  Constant Progression:  Improving     Past Medical History:  Diagnosis Date   Endometriosis    Hiatal hernia    PCOS (polycystic ovarian syndrome)     Patient Active Problem List   Diagnosis Date Noted   Bipolar I disorder, most recent episode mixed (HCC) 06/22/2020   Generalized anxiety disorder 06/22/2020   PTSD (post-traumatic stress disorder) 06/22/2020    Past Surgical History:  Procedure Laterality Date   ADENOIDECTOMY     ESOPHAGOGASTRODUODENOSCOPY     TONSILLECTOMY       OB History   No obstetric history on file.     Family History  Problem Relation Age of Onset   Endometriosis Mother    Polycystic ovary syndrome Mother    Diabetes Father    Heart failure Father     Social History   Tobacco Use   Smoking status: Every Day    Types: E-cigarettes   Smokeless tobacco: Never  Vaping Use    Vaping Use: Never used  Substance Use Topics   Alcohol use: Yes    Comment: Occ   Drug use: Yes    Types: Marijuana    Comment: 1 x monthly    Home Medications Prior to Admission medications   Medication Sig Start Date End Date Taking? Authorizing Provider  ARIPiprazole ER (ABILIFY MAINTENA) 400 MG PRSY prefilled syringe Inject 400 mg into the muscle every 28 (twenty-eight) days. 12/13/20   Shanna Cisco, NP  busPIRone (BUSPAR) 15 MG tablet Take 1 tablet (15 mg total) by mouth 3 (three) times daily. 12/13/20   Shanna Cisco, NP  divalproex (DEPAKOTE) 500 MG DR tablet Take 1 tablet (500 mg total) by mouth 3 (three) times daily. 12/13/20   Shanna Cisco, NP  famotidine (PEPCID) 20 MG tablet Take 1 tablet (20 mg total) by mouth 2 (two) times daily. 11/09/20   Farrel Gordon, PA-C  famotidine (PEPCID) 40 MG tablet Take 1 tablet (40 mg total) by mouth daily. 06/11/20   Haskel Schroeder, PA-C  ibuprofen (ADVIL) 200 MG tablet Take 440 mg by mouth daily as needed (finger pain).    [provider]  mirtazapine (REMERON) 15 MG tablet Take 1 tablet (15 mg total) by mouth at bedtime. 12/13/20   Toy Cookey E, NP  NORTREL 0.5/35, 28, 0.5-35 MG-MCG tablet Take 1 tablet by mouth daily. 09/12/20  [provider]  ondansetron (ZOFRAN ODT) 4 MG disintegrating tablet Take 1 tablet (4 mg total) by mouth every 8 (eight) hours as needed for nausea or vomiting. 06/11/20   Haskel Schroeder, PA-C  promethazine (PHENERGAN) 25 MG tablet Take 1 tablet (25 mg total) by mouth every 6 (six) hours as needed for nausea or vomiting. 11/09/20   Farrel Gordon, PA-C  propranolol (INDERAL) 40 MG tablet Take 1 tablet (40 mg total) by mouth 2 (two) times daily. 06/27/20   Shanna Cisco, NP    Allergies    Iodine, Penicillins, Shellfish allergy, Bactrim [sulfamethoxazole-trimethoprim], Cobalt, Lamictal [lamotrigine], Levaquin [levofloxacin], Nickel, and Trileptal [oxcarbazepine]  Review of  Systems   Review of Systems  Respiratory:  Positive for cough.   All other systems reviewed and are negative.  Physical Exam Updated Vital Signs BP (!) 144/77 (BP Location: Left Arm)   Pulse 80   Temp 98.3 F (36.8 C) (Oral)   Resp 16   SpO2 100%   Physical Exam Vitals and nursing note reviewed.  Constitutional:      General: She is not in acute distress.    Appearance: Normal appearance. She is well-developed.  HENT:     Head: Normocephalic and atraumatic.     Mouth/Throat:     Pharynx: Posterior oropharyngeal erythema present.     Comments: Mild erythema noted to the posterior pharynx.  Uvula is midline.  No evidence of PTA.  No exudates seen on tonsils. Eyes:     Conjunctiva/sclera: Conjunctivae normal.     Pupils: Pupils are equal, round, and reactive to light.  Cardiovascular:     Rate and Rhythm: Normal rate and regular rhythm.     Heart sounds: Normal heart sounds.  Pulmonary:     Effort: Pulmonary effort is normal. No respiratory distress.     Breath sounds: Normal breath sounds.  Abdominal:     General: There is no distension.     Palpations: Abdomen is soft.     Tenderness: There is no abdominal tenderness.  Musculoskeletal:        General: No deformity. Normal range of motion.     Cervical back: Normal range of motion and neck supple.  Skin:    General: Skin is warm and dry.  Neurological:     General: No focal deficit present.     Mental Status: She is alert and oriented to person, place, and time.    ED Results / Procedures / Treatments   Labs (all labs ordered are listed, but only abnormal results are displayed) Labs Reviewed  GROUP A STREP BY PCR  RESP PANEL BY RT-PCR (FLU A&B, COVID) [id number]    EKG None  Radiology No results found.  Procedures Procedures   Medications Ordered in ED Medications - No data to display  ED Course  I have reviewed the triage vital signs and the nursing notes.  Pertinent labs & imaging results that were  available during my care of the patient were reviewed by me and considered in my medical decision making (see chart for details).    MDM Rules/Calculators/A&P                           MDM  MSE complete  Erica Rivers was evaluated in Emergency Department on 12/20/2020 for the symptoms described in the history of present illness. She was evaluated in the context of the global COVID-19 pandemic, which necessitated consideration that the patient  might be at risk for infection with the SARS-CoV-2 virus that causes COVID-19. Institutional protocols and algorithms that pertain to the evaluation of patients at risk for COVID-19 are in a state of rapid change based on information released by regulatory bodies including the CDC and federal and state organizations. These policies and algorithms were followed during the patient's care in the ED.  Patient is presenting with complaint of sore throat and mild cough.  Patient is currently on day 5 of amoxicillin.  She plans on completing a 7-day course (and has enough at home to complete the course).  She reports close household contacts were diagnosed with strep.  She obtained her amoxicillin from a family member.  She has not had any work-up thus far for her symptoms.  She is requesting a strep test to confirm her suspicion of strep.  She is also requesting a COVID screen so that she can return to work.  Patient is nontoxic in appearance.  She understands need for close follow-up in the outpatient setting.  She is comfortable checking her test results later today through MyChart.   Final Clinical Impression(s) / ED Diagnoses Final diagnoses:  Sore throat    Rx / DC Orders ED Discharge Orders     None        Wynetta Fines, MD 12/20/20 1104

## 2020-12-20 NOTE — Discharge Instructions (Addendum)
Return for any problem.  ?

## 2021-01-09 ENCOUNTER — Ambulatory Visit (HOSPITAL_COMMUNITY): Payer: No Payment, Other | Admitting: Licensed Clinical Social Worker

## 2021-01-09 ENCOUNTER — Ambulatory Visit (HOSPITAL_COMMUNITY): Payer: No Payment, Other

## 2021-02-13 ENCOUNTER — Ambulatory Visit (HOSPITAL_COMMUNITY): Payer: Self-pay | Admitting: Licensed Clinical Social Worker

## 2021-03-20 ENCOUNTER — Encounter (HOSPITAL_COMMUNITY): Payer: No Payment, Other | Admitting: Psychiatry

## 2022-08-18 IMAGING — US US ABDOMEN LIMITED
1 series · 15 of 25 positions shown · non-contrast
Comparison: CT abdomen pelvis dated June 11, 2020

CLINICAL DATA: Nausea and vomiting for 1 year

EXAM:
ULTRASOUND ABDOMEN LIMITED RIGHT UPPER QUADRANT

[Series 1: us abdomen limited ruq mc & wl · 15 of 41 slices shown]
[im 1/41]
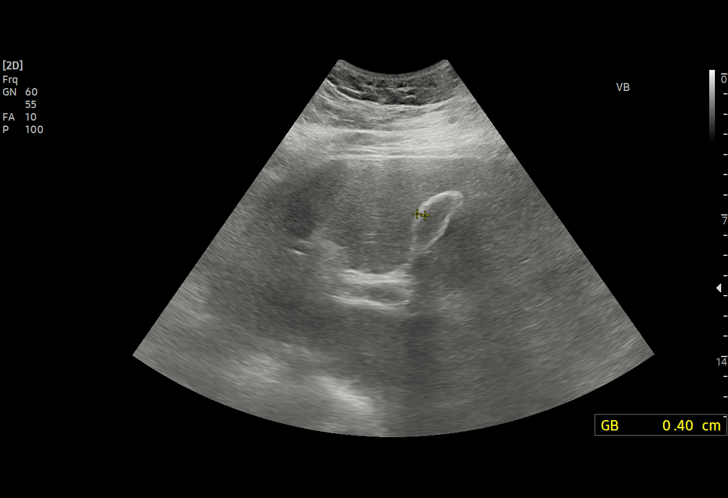
[im 4/41]
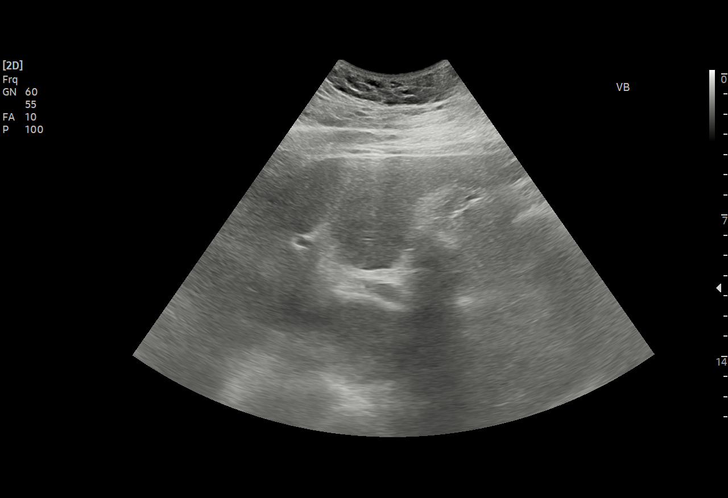
[im 7/41]
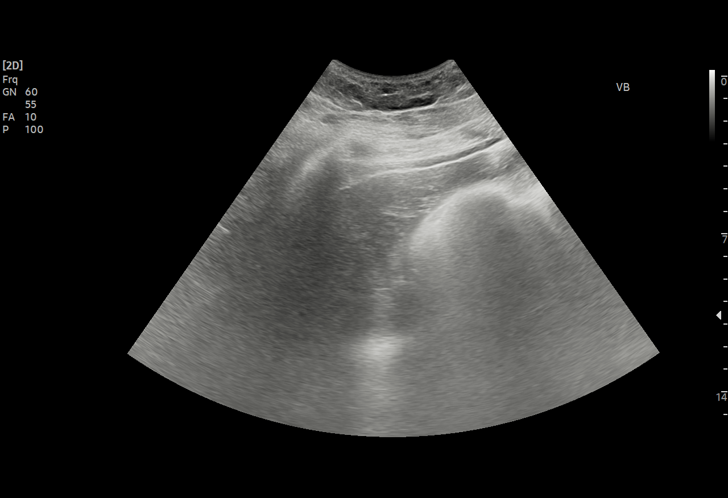
[im 9/41]
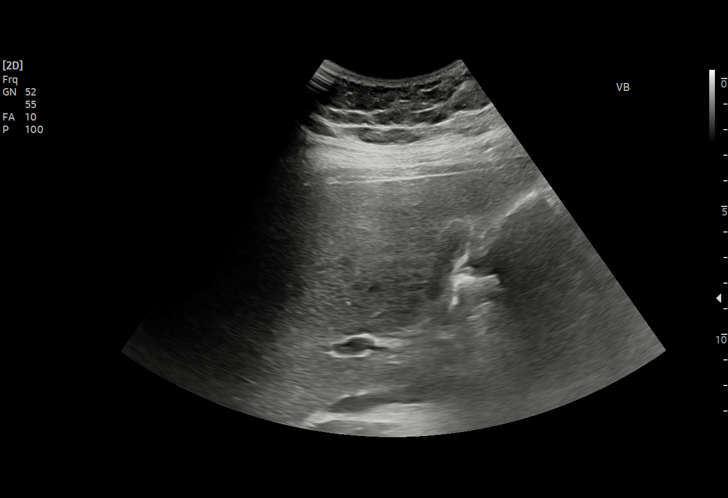
[im 12/41]
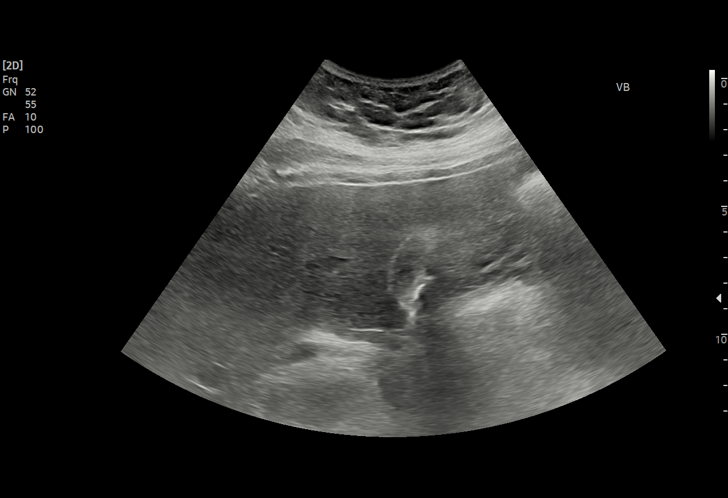
[im 16/41]
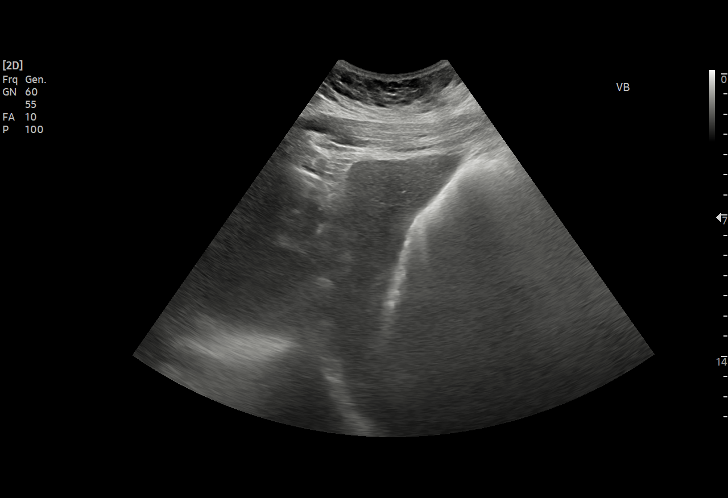
[im 17/41]
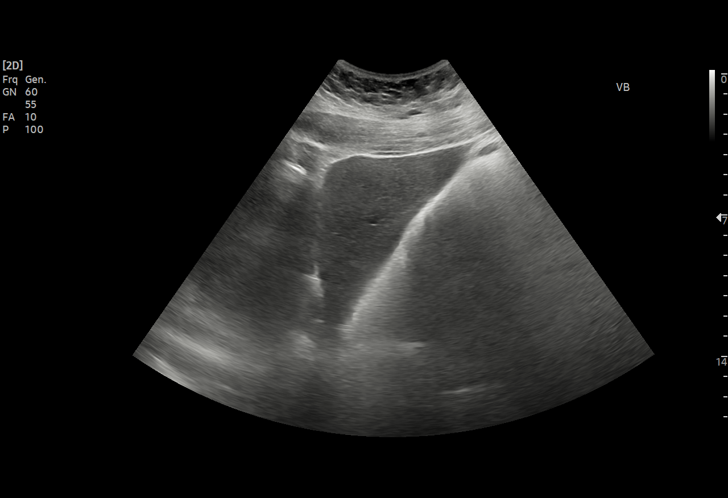
[im 21/41]
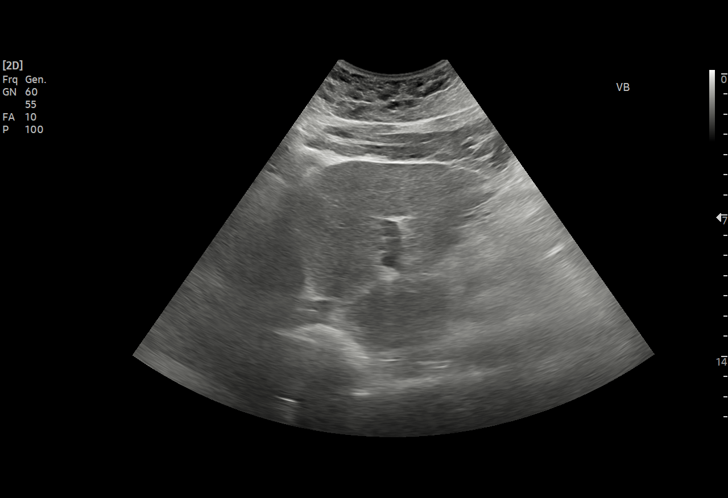
[im 24/41]
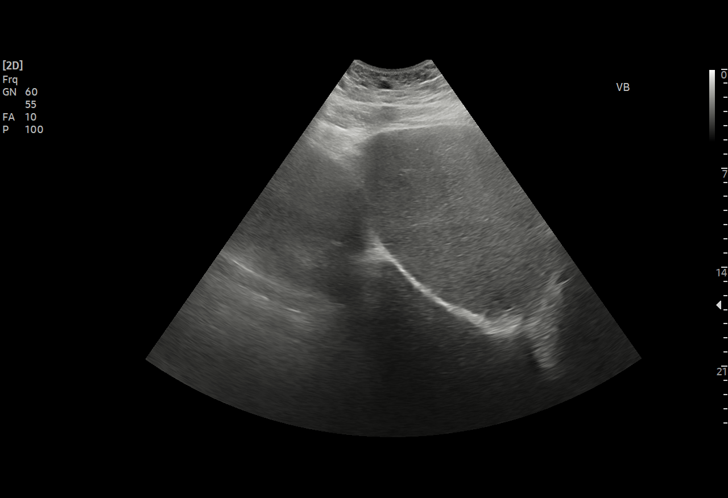
[im 26/41]
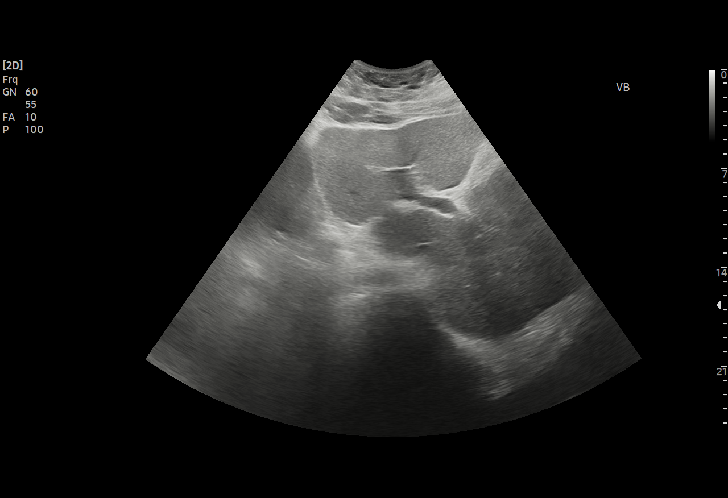
[im 29/41]
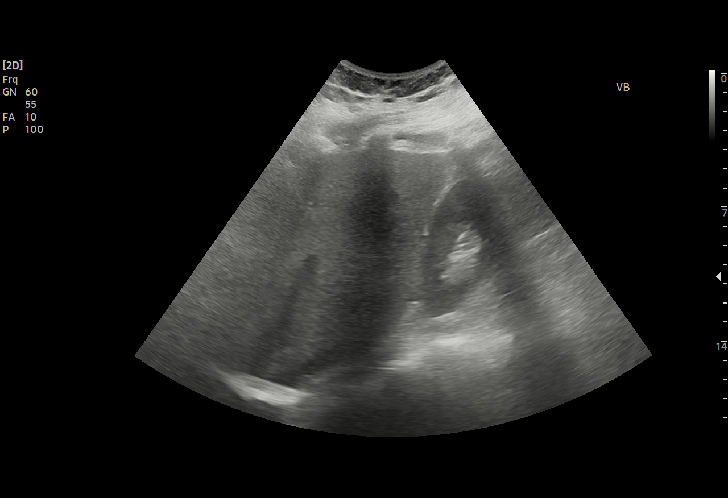
[im 32/41]
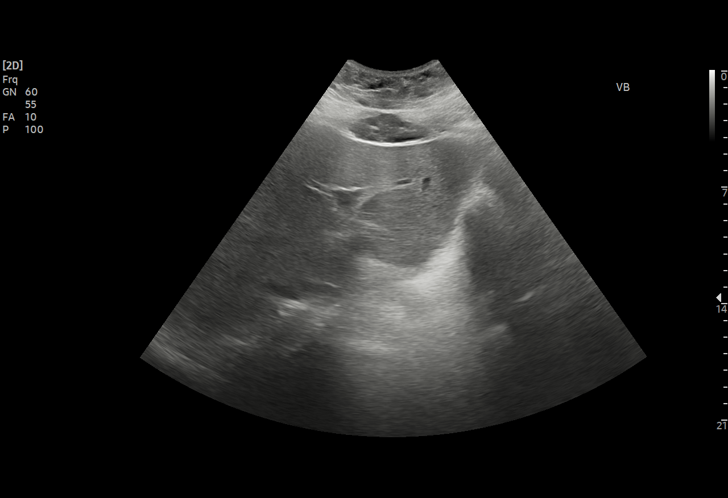
[im 34/41]
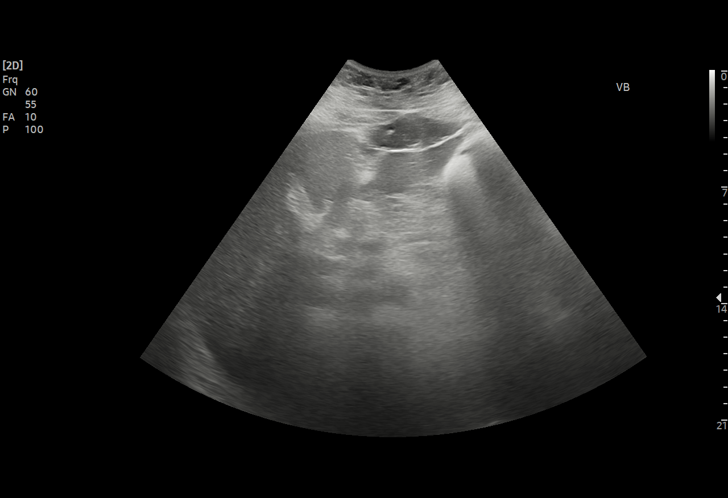
[im 37/41]
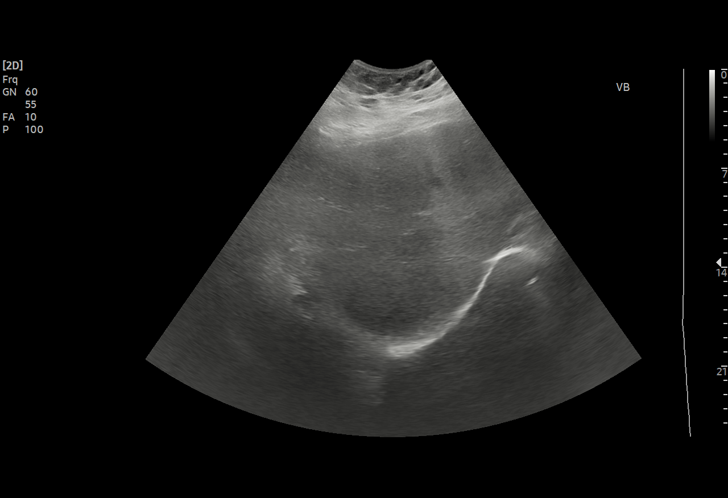
[im 41/41]
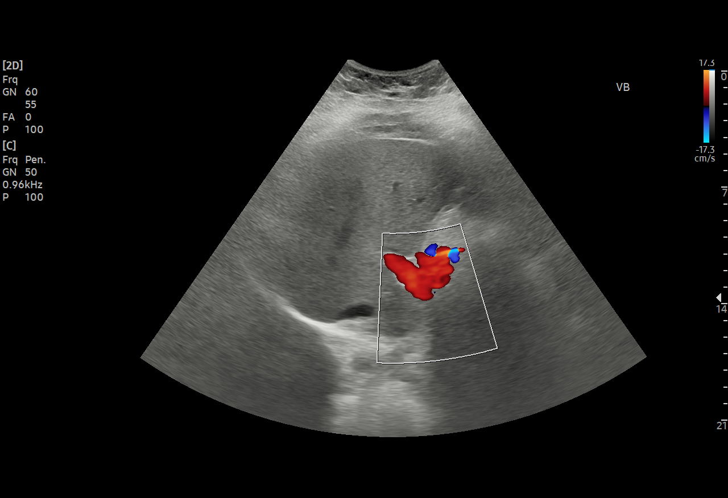

[15 of 25 positions shown; findings below may reference images not displayed]

FINDINGS: Gallbladder:

Gallbladder wall measures up to 4 mm, apparent mild wall thickening
is likely related to gallbladder wall contraction. No gallstones
visualized. No sonographic Murphy sign noted by sonographer.

Common bile duct:

Diameter: 2 mm

Liver:

No focal lesion identified. Within normal limits in parenchymal
echogenicity. Portal vein is patent on color Doppler imaging with
normal direction of blood flow towards the liver.

Other: None.
IMPRESSION: Gallbladder is contracted. Within these limitations no gallstones
are visualized.

## 2022-08-18 IMAGING — CT CT ABD-PELV W/O CM
2 of 4 series · 17 of 46 positions shown, 19 images · non-contrast
Comparison: 06/11/2020

CLINICAL DATA: Epigastric abdominal and right flank pain.
Hematemesis.

EXAM:
CT ABDOMEN AND PELVIS WITHOUT CONTRAST
TECHNIQUE: Multidetector CT imaging of the abdomen and pelvis was performed
following the standard protocol without IV contrast.

[Series 2: axial st · axial · 0.72mm/px · z∈[+1042,+1467]mm · 14 of 97 slices shown, 16 images]
[im 6/97  soft-tissue]
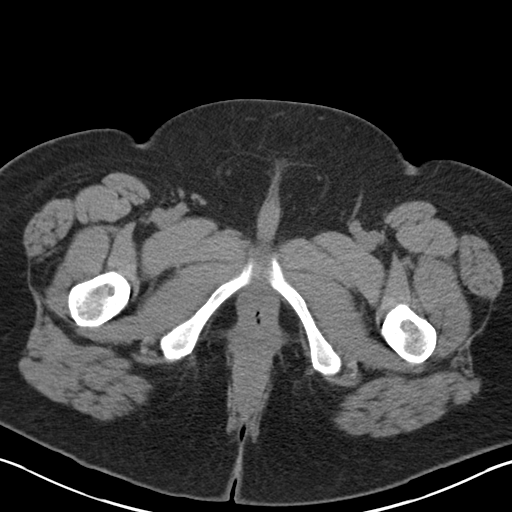
[im 6/97  bone]
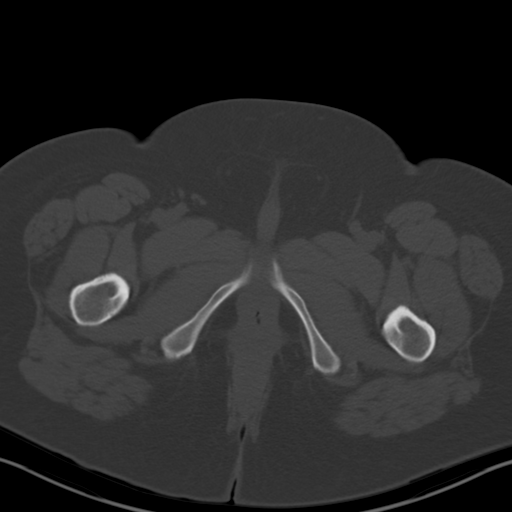
[im 12/97  soft-tissue]
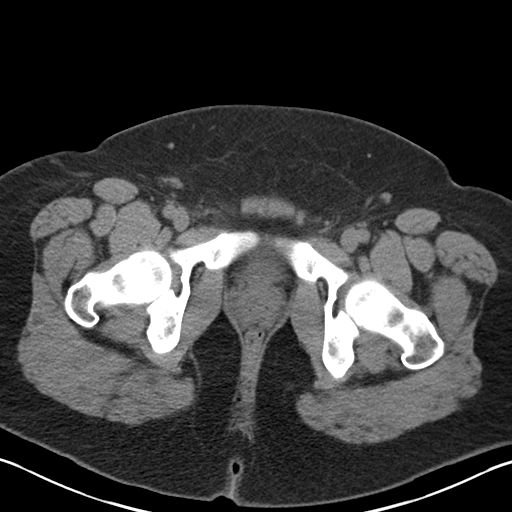
[im 17/97  soft-tissue]
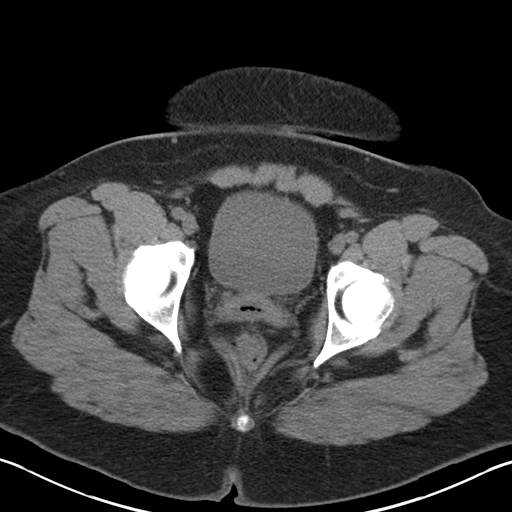
[im 29/97  soft-tissue]
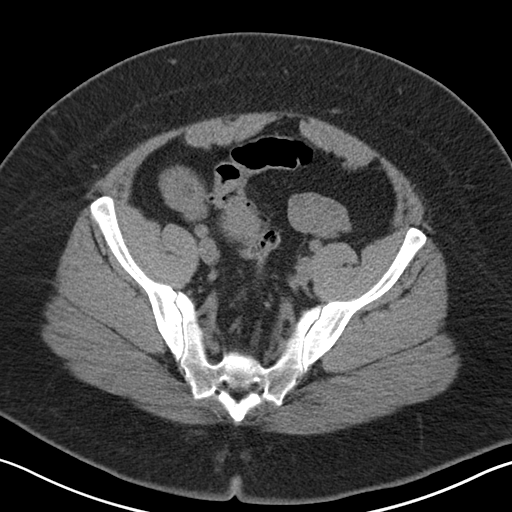
[im 34/97  soft-tissue]
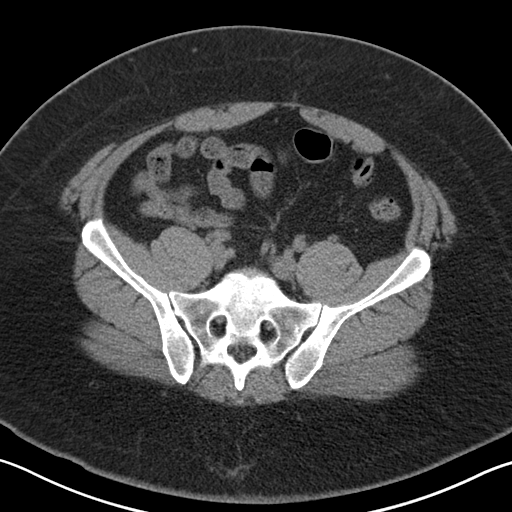
[im 40/97  soft-tissue]
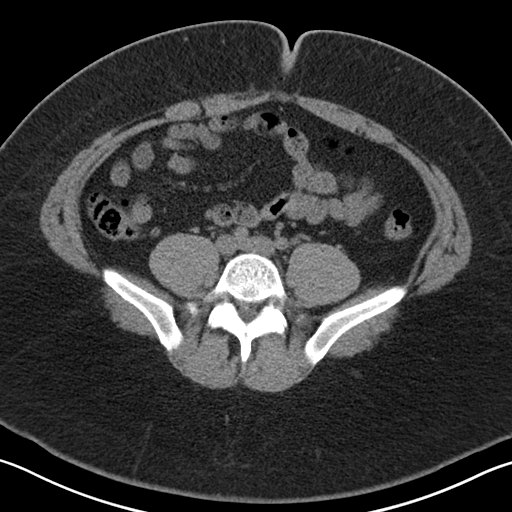
[im 46/97  soft-tissue]
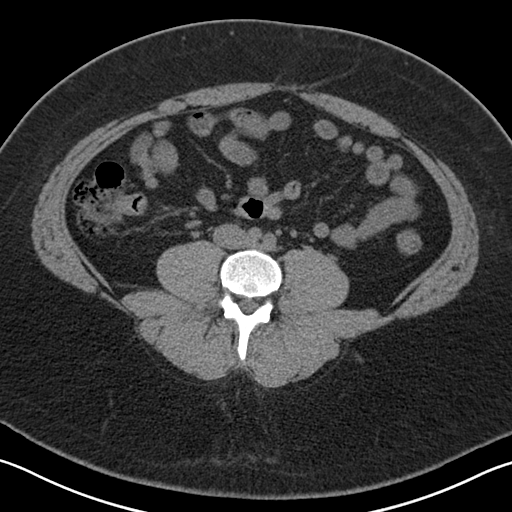
[im 51/97  soft-tissue]
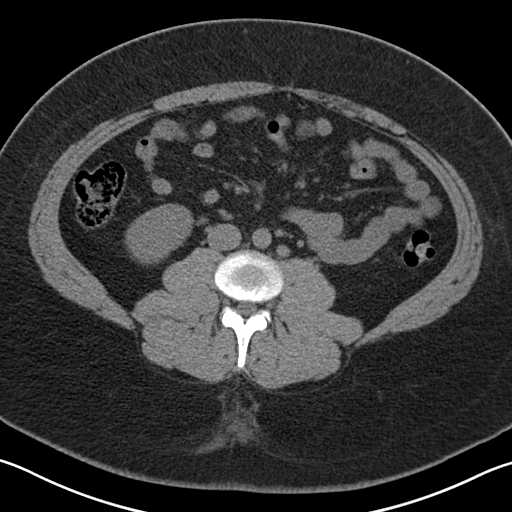
[im 57/97  soft-tissue]
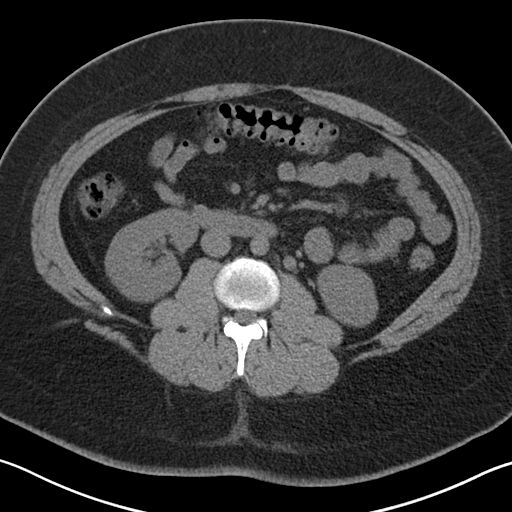
[im 57/97  bone]
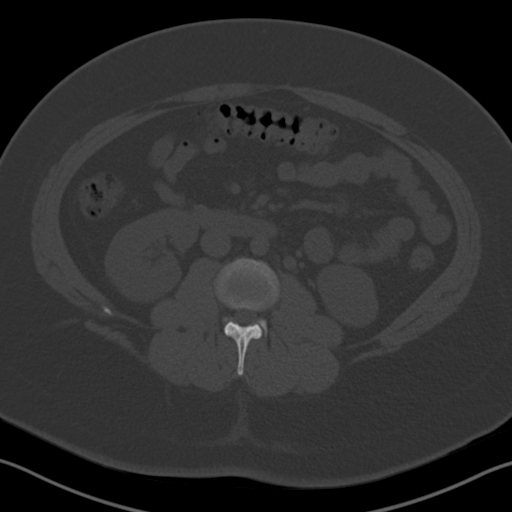
[im 63/97  soft-tissue]
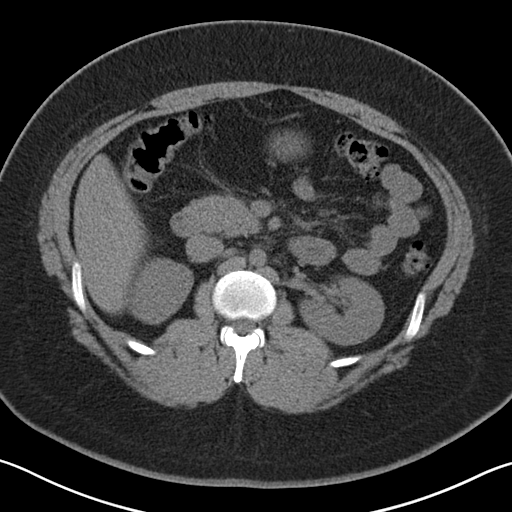
[im 74/97  soft-tissue]
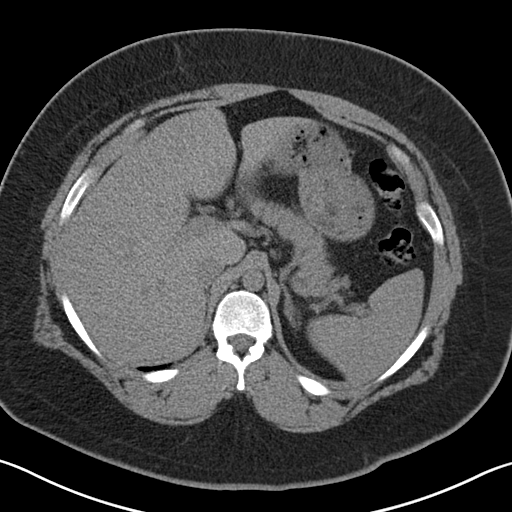
[im 80/97  soft-tissue]
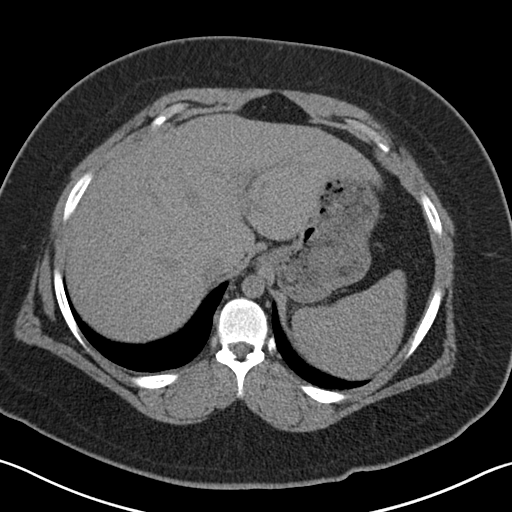
[im 85/97  soft-tissue]
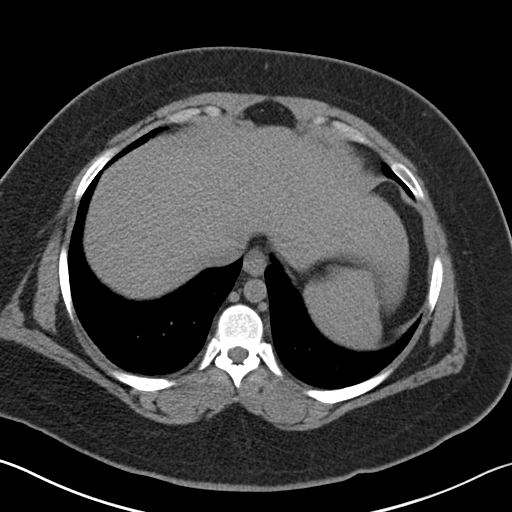
[im 91/97  soft-tissue]
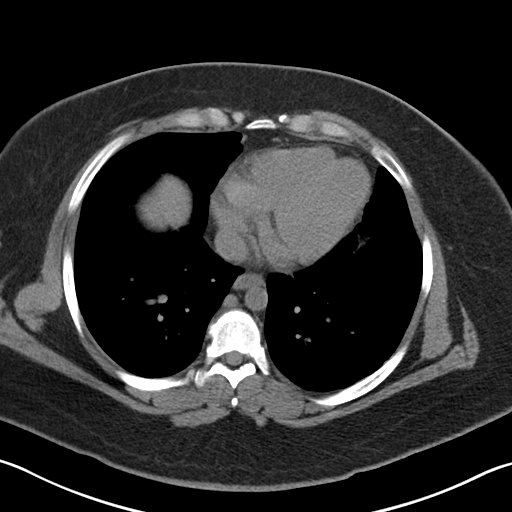

[Series 5: coronal st · coronal · 0.90mm/px · 3 of 166 slices shown]
[im 56/166  soft-tissue]
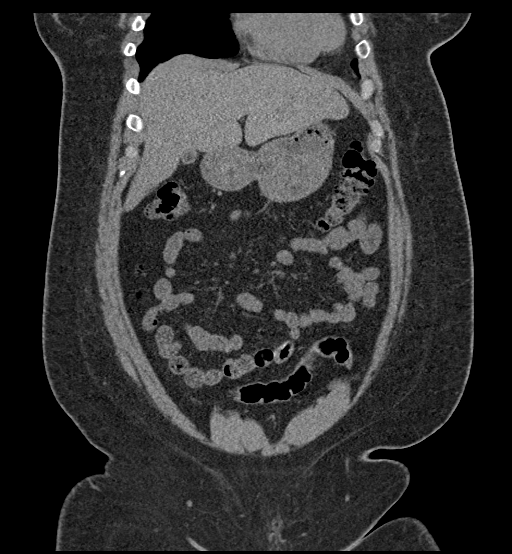
[im 74/166  soft-tissue]
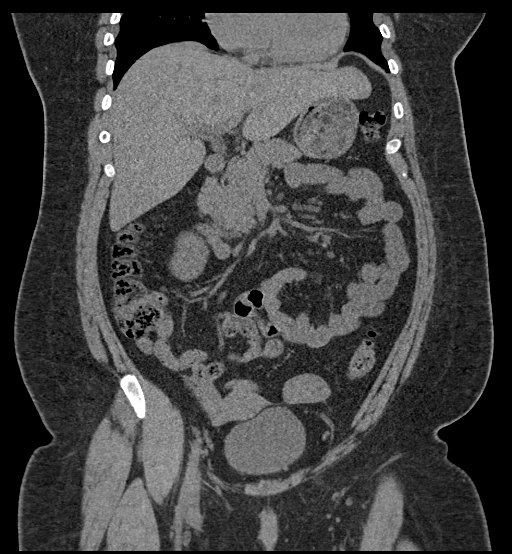
[im 92/166  soft-tissue]
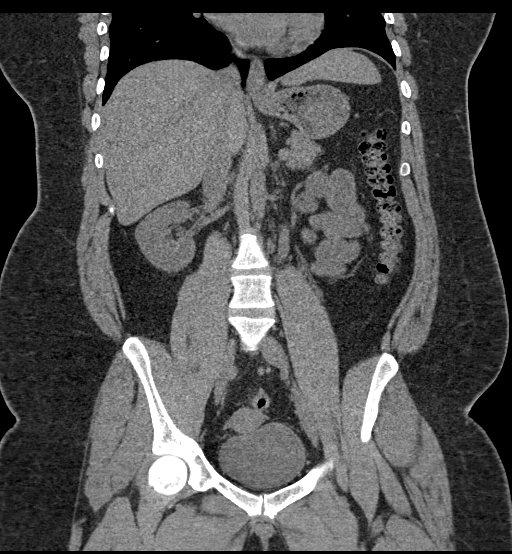

[17 of 46 positions shown; findings below may reference images not displayed]

FINDINGS: Lower chest: No acute findings.

Hepatobiliary: No mass visualized on this unenhanced exam.
Gallbladder is unremarkable. No evidence of biliary ductal
dilatation.

Pancreas: No mass or inflammatory process visualized on this
unenhanced exam.

Spleen:  Within normal limits in size.

Adrenals/Urinary tract: No evidence of urolithiasis or
hydronephrosis. Unremarkable unopacified urinary bladder.

Stomach/Bowel: No evidence of obstruction, inflammatory process, or
abnormal fluid collections. Normal appendix visualized.

Vascular/Lymphatic: No pathologically enlarged lymph nodes
identified. No evidence of abdominal aortic aneurysm.

Reproductive:  No mass or other significant abnormality.

Other:  None.

Musculoskeletal:  No suspicious bone lesions identified.
IMPRESSION: Negative. No evidence of urolithiasis, hydronephrosis, or other
acute findings.
